# Patient Record
Sex: Male | Born: 1950 | Race: White | Hispanic: No | Marital: Married | Smoking: Never smoker
Health system: Southern US, Community
[De-identification: ages and names within clinical notes are randomized; demographics above are authoritative.]

## PROBLEM LIST (undated history)

## (undated) DIAGNOSIS — I4891 Unspecified atrial fibrillation: Secondary | ICD-10-CM

## (undated) DIAGNOSIS — M791 Myalgia, unspecified site: Secondary | ICD-10-CM

## (undated) DIAGNOSIS — G4730 Sleep apnea, unspecified: Secondary | ICD-10-CM

## (undated) DIAGNOSIS — I514 Myocarditis, unspecified: Secondary | ICD-10-CM

## (undated) HISTORY — DX: Myocarditis, unspecified: I51.4

## (undated) HISTORY — DX: Sleep apnea, unspecified: G47.30

## (undated) HISTORY — DX: Unspecified atrial fibrillation: I48.91

## (undated) HISTORY — DX: Myalgia, unspecified site: M79.10

---

## 1997-04-30 HISTORY — PX: ATRIAL ABLATION SURGERY: SHX560

## 1997-08-16 ENCOUNTER — Ambulatory Visit (HOSPITAL_COMMUNITY): Admission: RE | Admit: 1997-08-16 | Discharge: 1997-08-16 | Payer: Self-pay | Admitting: Cardiology

## 1998-08-02 ENCOUNTER — Encounter: Admission: RE | Admit: 1998-08-02 | Discharge: 1998-08-02 | Payer: Self-pay | Admitting: Family Medicine

## 1999-06-22 ENCOUNTER — Encounter: Admission: RE | Admit: 1999-06-22 | Discharge: 1999-06-22 | Payer: Self-pay | Admitting: Family Medicine

## 1999-10-06 ENCOUNTER — Ambulatory Visit (HOSPITAL_COMMUNITY): Admission: RE | Admit: 1999-10-06 | Discharge: 1999-10-06 | Payer: Self-pay | Admitting: Gastroenterology

## 1999-10-06 ENCOUNTER — Encounter (INDEPENDENT_AMBULATORY_CARE_PROVIDER_SITE_OTHER): Payer: Self-pay | Admitting: *Deleted

## 2001-05-29 ENCOUNTER — Encounter: Admission: RE | Admit: 2001-05-29 | Discharge: 2001-05-29 | Payer: Self-pay | Admitting: Sports Medicine

## 2002-10-06 ENCOUNTER — Emergency Department (HOSPITAL_COMMUNITY): Admission: EM | Admit: 2002-10-06 | Discharge: 2002-10-06 | Payer: Self-pay | Admitting: Emergency Medicine

## 2002-10-06 ENCOUNTER — Encounter: Payer: Self-pay | Admitting: Emergency Medicine

## 2002-10-10 ENCOUNTER — Emergency Department (HOSPITAL_COMMUNITY): Admission: EM | Admit: 2002-10-10 | Discharge: 2002-10-10 | Payer: Self-pay | Admitting: Emergency Medicine

## 2002-10-12 ENCOUNTER — Emergency Department (HOSPITAL_COMMUNITY): Admission: EM | Admit: 2002-10-12 | Discharge: 2002-10-12 | Payer: Self-pay | Admitting: Emergency Medicine

## 2006-06-27 DIAGNOSIS — I4891 Unspecified atrial fibrillation: Secondary | ICD-10-CM | POA: Insufficient documentation

## 2006-06-27 DIAGNOSIS — B351 Tinea unguium: Secondary | ICD-10-CM | POA: Insufficient documentation

## 2006-06-27 DIAGNOSIS — E785 Hyperlipidemia, unspecified: Secondary | ICD-10-CM | POA: Insufficient documentation

## 2007-03-03 ENCOUNTER — Encounter: Payer: Self-pay | Admitting: Cardiovascular Disease

## 2008-12-27 ENCOUNTER — Emergency Department (HOSPITAL_COMMUNITY): Admission: EM | Admit: 2008-12-27 | Discharge: 2008-12-27 | Payer: Self-pay | Admitting: Emergency Medicine

## 2010-09-15 NOTE — Procedures (Signed)
[place]. [facility]  Patient:    William Dodson, William Dodson                     MRN: [id number] Proc. Date: 10/06/99 Adm. Date:  [id number] Disc. Date: [id number] Attending:  Rich Brave CC:         Dr. Electa Sniff, [facility]                           Procedure Report  PROCEDURE:  Colonoscopy with hot biopsy.  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  FINDINGS:  Diminutive rectal polyps. Moderate internal hemorrhoids.  DESCRIPTION OF PROCEDURE:  The nature, purpose and risk of the procedure have been discussed with the patient who provided written consent. At his request, light sedation was administered so we only used fentanyl 40 mcg and versed 3 mg resulting in mild sedation without any arrhythmias or desaturations.  Digital exam of the prostate was partly limited due to increased anal sphincter tone but no abnormalities were appreciated. The Olympus adult video colonoscope was advanced quite easily around the colon to the cecum, turning the patient into the supine position to facilitate passage of the tip of the scope into the base of the cecum. Pullback was then performed. There was a little bit of stool film coating the proximal colon but this was able to be irrigated away to a large extent and it is not felt that any significant lesions would have been missed.  This was an essentially normal examination. There was a tiny sessile polyp in the cecum removed by a single cold biopsy, and there were also a couple of small hyperplastic appearing sessile polyps in the rectum removed by hot biopsy technique. There were also some additional sessile polyps in the rectum which essentially seemed to flatten out with insufflation and I elected not to hot biopsy.  The overall quality of the prep was very good and it is felt that essentially all areas were well seen during this  examination and that no significant lesions would have been missed. Retroflexion was not performed in the rectum due to a fairly small rectal ampulla but pullout through the anal canal demonstrated moderately prominent internal hemorrhoids. No alternative source of rectal bleeding was identified.  No large polyps, cancer, colitis, vascular malformations or diverticular disease were observed during this exam. The patient tolerated the exam well and there were no apparent complications.  Note that the patient received ampicillin 1 gm and gentamycin 80 mg IV because he had been recommended to have antibiotic prophylaxis prior to dental work following an episode of what sounds like viral pericarditis some years ago.  IMPRESSION: 1. Moderate internal hemorrhoids which are the presumed source of the    patients intermittent small volume hematochezia. 2. Diminutive colonic polyps, not likely clinically significant.  PLAN:  Await pathology on the polyps with further management to depend on the histologic findings of the polyps. DD:  10/06/99 TD:  10/10/99 Job: [id number]

## 2010-12-19 ENCOUNTER — Encounter: Payer: Self-pay | Admitting: Cardiovascular Disease

## 2010-12-20 ENCOUNTER — Encounter: Payer: Self-pay | Admitting: *Deleted

## 2010-12-20 ENCOUNTER — Encounter: Payer: Self-pay | Admitting: Cardiovascular Disease

## 2010-12-21 ENCOUNTER — Encounter: Payer: Self-pay | Admitting: Cardiovascular Disease

## 2010-12-21 ENCOUNTER — Ambulatory Visit (INDEPENDENT_AMBULATORY_CARE_PROVIDER_SITE_OTHER): Payer: BC Managed Care – PPO | Admitting: Cardiovascular Disease

## 2010-12-21 ENCOUNTER — Ambulatory Visit (HOSPITAL_COMMUNITY): Payer: BC Managed Care – PPO | Attending: Cardiovascular Disease | Admitting: Radiology

## 2010-12-21 VITALS — BP 110/70 | HR 112 | Resp 12 | Ht 65.0 in | Wt 210.0 lb

## 2010-12-21 DIAGNOSIS — I080 Rheumatic disorders of both mitral and aortic valves: Secondary | ICD-10-CM | POA: Insufficient documentation

## 2010-12-21 DIAGNOSIS — E669 Obesity, unspecified: Secondary | ICD-10-CM | POA: Insufficient documentation

## 2010-12-21 DIAGNOSIS — E785 Hyperlipidemia, unspecified: Secondary | ICD-10-CM

## 2010-12-21 DIAGNOSIS — I079 Rheumatic tricuspid valve disease, unspecified: Secondary | ICD-10-CM | POA: Insufficient documentation

## 2010-12-21 DIAGNOSIS — I4891 Unspecified atrial fibrillation: Secondary | ICD-10-CM

## 2010-12-21 NOTE — Progress Notes (Signed)
60 you with longstanding atrial fibrillation.  Referred by Dr Merla Riches.  Has not been evaluated in about 3 years.  Afib ablation at [facility] ? 96 with Ronn Melena.  Last seen by Dr Shelva Majestic.  Describes having normal cors by cath and normal stress tests in past.  Indicates sever episode of inflamation of heart over 20 years ago that started all his electrical problems. Not clear if this was myocarditis or not.  No records available in echart.  Indicates care by Dr Electa Sniff at time.  [place] score is less than 2.  He is asymptomatic.  Thinks he goes in and out of fibrillation but I suspect it is chronic.  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  8/10 had laceration to left arm from work related accident.  On questioning denies dyspnea, SSCP, syncope or TIAls.  Occasonal palpitations.  Intolerant to multiple beta blockers in past.  Not on any AV nodal blocking drugs at this time and no ASA.  Thought it would be useful for him to F/U with Dr Johney Frame since he has had previous ablation.    ROS: Denies fever, malais, weight loss, blurry vision, decreased visual acuity, cough, sputum, SOB, hemoptysis, pleuritic pain, palpitaitons, heartburn, abdominal pain, melena, lower extremity edema, claudication, or rash.  All other systems reviewed and negative   General: Affect appropriate Healthy:  appears stated age HEENT: normal Neck supple with no adenopathy JVP normal no bruits no thyromegaly Lungs clear with no wheezing and good diaphragmatic motion Heart:  S1/S2 no murmur,rub, gallop or click PMI normal Abdomen: benighn, BS positve, no tenderness, no AAA no bruit.  No HSM or HJR Distal pulses intact with no bruits No edema Neuro non-focal Skin warm and dry No muscular weakness  Medications Current Outpatient Prescriptions  Medication Sig Dispense Refill  . co-enzyme Q-10 30 MG capsule Take 30 mg by mouth daily.        . NON FORMULARY Vitamin  E 1 tab po qd       . NON FORMULARY Garlic 1 tab po qd       . Omega-3 Fatty Acids (FISH OIL PO) Take by mouth daily.          Allergies Doxycycline; Hydrocodone; and Septra  Family History: No family history on file.  Social History: History   Social History  . Marital Status: Single    Spouse Name: N/A    Number of Children: N/A  . Years of Education: N/A   Occupational History  . Not on file.   Social History Main Topics  . Smoking status: Former Games developer  . Smokeless tobacco: Not on file  . Alcohol Use: Not on file  . Drug Use: Not on file  . Sexually Active: Not on file   Other Topics Concern  . Not on file   Social History Narrative  . No narrative on file    Electrocardiogram:  Afib 112 nonspecific ST/T wave changes  Assessment and Plan

## 2010-12-21 NOTE — Assessment & Plan Note (Signed)
Low carb low fat diet F/U Dr Merla Riches. No known vascular disease

## 2010-12-21 NOTE — Patient Instructions (Addendum)
Your physician has requested that you have an echocardiogram. Echocardiography is a painless test that uses sound waves to create images of your heart. It provides your doctor with information about the size and shape of your heart and how well your heart's chambers and valves are working. This procedure takes approximately one hour. There are no restrictions for this procedure.    You have been referred to Dr Johney Frame for Atrial Fibrillation- Previous Ablation

## 2010-12-21 NOTE — Assessment & Plan Note (Signed)
Have sent for records at [facility].  Not on ASA or AV nodal blocking drugs.  Would agree with patient that risk of anticoagulation higher than risk of stroke given his occupation.  Echo and F/U [facility].  Dont know if it is worth seeing if he is persistant or paroxysmal as he is asymptomatic.  Consdier ASA

## 2010-12-25 ENCOUNTER — Encounter: Payer: Self-pay | Admitting: *Deleted

## 2011-01-04 ENCOUNTER — Telehealth: Payer: Self-pay | Admitting: *Deleted

## 2011-01-04 MED ORDER — DIGOXIN 125 MCG PO TABS: 125.0000 ug | ORAL_TABLET | Freq: Every day | ORAL | Status: DC

## 2011-01-04 MED ORDER — LISINOPRIL 5 MG PO TABS: 5.0000 mg | ORAL_TABLET | Freq: Every day | ORAL | Status: DC

## 2011-01-04 NOTE — Telephone Encounter (Signed)
Message copied by Freddi Starr on Thu Jan 04, 2011  5:27 PM ------      Message from: Wendall Stade      Created: Mon Dec 25, 2010  1:49 PM       EF 40-45% see if he is willing to try Digoxen .125mg  and lisinopril 5mg   Intolerant to beta blockers in past

## 2011-01-04 NOTE — Telephone Encounter (Signed)
Spoke with pt, echo results from 2008 reviewed by dr Eden Emms. Explained to pt that in 2008 his EF% was normal and now it is lower. Dr Eden Emms felt this maybe due to the pts heart rate being elevated. Pt is willing to try dig and lisinopril. He will call with problems after starting new meds Deliah Goody

## 2011-01-26 ENCOUNTER — Encounter: Payer: Self-pay | Admitting: Internal Medicine

## 2011-01-29 ENCOUNTER — Ambulatory Visit (INDEPENDENT_AMBULATORY_CARE_PROVIDER_SITE_OTHER): Payer: BC Managed Care – PPO | Admitting: Internal Medicine

## 2011-01-29 ENCOUNTER — Encounter: Payer: Self-pay | Admitting: Internal Medicine

## 2011-01-29 DIAGNOSIS — I519 Heart disease, unspecified: Secondary | ICD-10-CM

## 2011-01-29 DIAGNOSIS — G4730 Sleep apnea, unspecified: Secondary | ICD-10-CM | POA: Insufficient documentation

## 2011-01-29 DIAGNOSIS — I4891 Unspecified atrial fibrillation: Secondary | ICD-10-CM

## 2011-01-29 MED ORDER — DIGOXIN 125 MCG PO TABS: 125.0000 ug | ORAL_TABLET | Freq: Every day | ORAL | Status: DC

## 2011-01-29 NOTE — Assessment & Plan Note (Signed)
EF 40-45% No symptoms of ischemia or CHF.  The importance of heart rate control was stressed today. I have also encouraged compliance with lisinopril as ordered by Dr Eden Emms.   He will follow-up with Dr Eden Emms and I will see him as needed.

## 2011-01-29 NOTE — Patient Instructions (Signed)
Your physician recommends that you schedule a follow-up appointment as needed  

## 2011-01-29 NOTE — Assessment & Plan Note (Signed)
He recently had a sleep study obtained by his PCP. He will follow for results with his PCP.

## 2011-01-29 NOTE — Progress Notes (Signed)
William Dodson is a pleasant 60 y.o. yo patient with a h/o persistent atrial fibrillation who presents today for EP consultation.  He reports initially developing atrial fibrillation in the early 1990s.  He reports having "a heart infection" (likely myocarditis) in the setting of pneumonia.  He was observed to have afib at that time.  He reports that he was evaluated by Dr Ronn Melena at [facility] and underwent catheter ablation in the late 1990s.  He has done well since that time.  He was initially followed by Dr Arlyss Gandy but has not seen a physician for 3-4 years.  Most recently, he presented to see Dr Eden Emms and was found to be in afib. The patient reports preserved exercise tolerance, though he admits to dypsnea with ambulation up more than 1 flight of stairs.   Today, he denies symptoms of palpitations, chest pain, shortness of breath with usual activity, orthopnea, PND, lower extremity edema, dizziness, presyncope, syncope, or neurologic sequela. The patient is tolerating medications without difficulties and is otherwise without complaint today. He recently had a sleep study but is unaware of the results.   He has not started lisinopril or digoxin as prescribed by Dr Eden Emms.  He states that he checks his pulse frequently and finds that it is frequently 90s.    Past Medical History  Diagnosis Date  . Sleep apnea     recent sleep study, results pending  . Myalgia   . Atrial fibrillation     persistent,  s/p PVI by Dr Delena Serve at [facility]  . 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 Atrial ablation surgery 1999    afib ablation 1990s at [facility] by Ronn Melena    Current Outpatient Prescriptions  Medication Sig Dispense Refill  . co-enzyme Q-10 30 MG capsule Take 30 mg by mouth daily.        . NON FORMULARY Vitamin E 1 tab po qd       . NON FORMULARY Garlic 1 tab po qd       . Omega-3 Fatty Acids (FISH OIL PO) Take by  mouth daily.        . digoxin (LANOXIN) 0.125 MG tablet Take 1 tablet (125 mcg total) by mouth daily. ( NOT TAKING )  30 tablet  11  . lisinopril (PRINIVIL,ZESTRIL) 5 MG tablet Take 5 mg by mouth daily. ( NOT TAKING )       . DISCONTD: digoxin (LANOXIN) 0.125 MG tablet Take 1 tablet (125 mcg total) by mouth daily.  30 tablet  11  . DISCONTD: digoxin (LANOXIN) 0.125 MG tablet Take 125 mcg by mouth daily. ( NOT TAKING )         Allergies  Allergen Reactions  . Doxycycline     Itching   . Hydrocodone     Itching   . Septra (Bactrim)     Rash     History   Social History  . Marital Status: Single    Spouse Name: N/A    Number of Children: N/A  . Years of Education: N/A   Occupational History  . Sculptor    Social History Main Topics  . Smoking status: Never Smoker   . Smokeless tobacco: Never Used   Comment: tried smoking 30 years ago  . Alcohol Use: Yes     1 glass of wine at night  . Drug Use: No  . Sexually Active:  Not on file   Other Topics Concern  . 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 Diabetes Mother   . Heart disease Father     ROS- All systems are reviewed and negative except as per the HPI above  Physical Exam: Filed Vitals:   01/29/11 0921  BP: 131/91  Pulse: 83  Height: 5\' 4"  (1.626 m)  Weight: 214 lb 8 oz (97.297 kg)    GEN- The patient is well appearing, alert and oriented x 3 today.   Head- normocephalic, atraumatic Eyes-  Sclera clear, conjunctiva pink Ears- hearing intact Oropharynx- clear Neck- supple, no JVP Lymph- no cervical lymphadenopathy Lungs- Clear to ausculation bilaterally, normal work of breathing Heart- irregular rate and rhythm, no murmurs, rubs or gallops, PMI not laterally displaced GI- soft, NT, ND, + BS Extremities- no clubbing, cyanosis, or edema MS- no  significant deformity or atrophy Skin- no rash or lesion Psych- euthymic mood, full affect Neuro- strength and sensation are intact  Echo 8/12 reveals EF 40-45%, mild AI, moderate to severe LA enlargement  Assessment and Plan:

## 2011-01-29 NOTE — Assessment & Plan Note (Signed)
The patient presents today for EP consultation regarding long standing persistent afib.  Fortunately, he is minimally asymptomatic with his afib. His EF is 40-45%.  I have stressed the importance of heart rate control today.  He says that he will begin digoxin as presribed by Dr Eden Emms. As he is doing well at this time, he has no interests in pursuit of rhythm control.  I think that rate control is a reasonable long term strategy.  Given his severe LA enlargment, I think that our ability to maintain sinus rhythm would likely be limited. His CHADSVASC score is 1-2 (possible HTN- he denies, and mildly reduced EF).  He is clear in his decision to not take Coumadin, Xarelto, or pradaxa.   Given his risks for bleeding with his job, this is probably reasonable.  He will try to take low dose ASA.

## 2011-02-16 ENCOUNTER — Encounter: Payer: Self-pay | Admitting: Cardiovascular Disease

## 2011-06-27 ENCOUNTER — Ambulatory Visit (INDEPENDENT_AMBULATORY_CARE_PROVIDER_SITE_OTHER): Payer: BC Managed Care – PPO | Admitting: Internal Medicine

## 2011-06-27 DIAGNOSIS — I10 Essential (primary) hypertension: Secondary | ICD-10-CM

## 2011-06-27 DIAGNOSIS — I4891 Unspecified atrial fibrillation: Secondary | ICD-10-CM

## 2011-06-27 DIAGNOSIS — G4733 Obstructive sleep apnea (adult) (pediatric): Secondary | ICD-10-CM

## 2011-06-27 DIAGNOSIS — R635 Abnormal weight gain: Secondary | ICD-10-CM

## 2011-06-27 NOTE — Progress Notes (Addendum)
Subjective:    Patient ID: William Dodson, male    DOB: 1950/11/27, 61 y.o.   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 He had a recent sleep study which was very positive but did very poorly with all of the mask apparatuses that he has tried. He has not yet had a consult with Dr. Richardean Chimera. He continues to notice non-restorative sleep with easy fatigability and daytime somnolence.   He has chronic atrial fibrillation that did not respond to ablation therapy in the late 90s, and is not a candidate for another surgery due to the markedly dilated left atrium. He is on digoxin 0.125 mg for rate control and has had multiple episodes of tachycardia in recent months. He has not had chest pain, syncope or shortness of breath associated with these. He has noted increased dyspnea on exertion but no peripheral edema. Because of the risk of his work he has elected to avoid anticoagulants other than aspirin. His last echo in October of 2012 showed an ejection fraction of 40-60%. There were no valve lesions.  He has gained 7-10 pounds since September 2012, despite a good diet and adequate exercise.  He also has noticed increased morning stiffness in his hands with pain in his hands at his job which is very labor intensive  Review of Systems     Objective:   Physical Exam There is no thyromegaly The heart has an irregularly irregular rhythm with a rate in the high 90s. The lungs are clear There is no peripheral edema He has mild hypertrophy of all the joints in both hands and mild tenderness      Assessment & Plan:  Problem #1 obstructive sleep apnea Refer to Dr. Richardean Chimera for consult  Problem #2 chronic atrial fibrillation With rapid ventricular response at times Will check digoxin level, metabolic profile, and CBC  Problem #3 hypertension-no change in current meds  Problem #4 recent weight gain TSH and free T4 if indicated  Results for orders placed in  visit on 06/27/11  CBC WITH DIFFERENTIAL      Component Value Range   WBC 6.1  4.0 - 10.5 (K/uL)   RBC 5.18  4.22 - 5.81 (MIL/uL)   Hemoglobin 16.1  13.0 - 17.0 (g/dL)   HCT 78.4  69.6 - 29.5 (%)   MCV 90.3  78.0 - 100.0 (fL)   MCH 31.1  26.0 - 34.0 (pg)   MCHC 34.4  30.0 - 36.0 (g/dL)   RDW 28.4  13.2 - 44.0 (%)   Platelets 212  150 - 400 (K/uL)   Neutrophils Relative 38 (*) 43 - 77 (%)   Neutro Abs 2.3  1.7 - 7.7 (K/uL)   Lymphocytes Relative 51 (*) 12 - 46 (%)   Lymphs Abs 3.1  0.7 - 4.0 (K/uL)   Monocytes Relative 9  3 - 12 (%)   Monocytes Absolute 0.5  0.1 - 1.0 (K/uL)   Eosinophils Relative 2  0 - 5 (%)   Eosinophils Absolute 0.1  0.0 - 0.7 (K/uL)   Basophils Relative 1  0 - 1 (%)   Basophils Absolute 0.1  0.0 - 0.1 (K/uL)   Smear Review Criteria for review not met    DIGOXIN LEVEL      Component Value Range   Digoxin Level 0.3 (*) 0.8 - 2.0 (ng/mL)  LIPID PANEL      Component Value Range   Cholesterol 186  0 - 200 (mg/dL)   Triglycerides  122  <150 (mg/dL)   HDL 51  >16 (mg/dL)   Total CHOL/HDL Ratio 3.6     VLDL 24  0 - 40 (mg/dL)   LDL Cholesterol 109 (*) 0 - 99 (mg/dL)  TSH      Component Value Range   TSH 3.236  0.350 - 4.500 (uIU/mL)  COMPREHENSIVE METABOLIC PANEL      Component Value Range   Sodium 142  135 - 145 (mEq/L)   Potassium 4.2  3.5 - 5.3 (mEq/L)   Chloride 103  96 - 112 (mEq/L)   CO2 26  19 - 32 (mEq/L)   Glucose, Bld 90  70 - 99 (mg/dL)   BUN 14  6 - 23 (mg/dL)   Creat 6.04  5.40 - 9.81 (mg/dL)   Total Bilirubin 0.6  0.3 - 1.2 (mg/dL)   Alkaline Phosphatase 54  39 - 117 (U/L)   AST 32  0 - 37 (U/L)   ALT 43  0 - 53 (U/L)   Total Protein 7.2  6.0 - 8.3 (g/dL)   Albumin 4.4  3.5 - 5.2 (g/dL)   Calcium 9.2  8.4 - 19.1 (mg/dL)    Will forward to Dr Eden Emms to see if going up on the dig would be a good response to increased rate plus dyspnea and easy fatigability

## 2011-06-28 DIAGNOSIS — G4733 Obstructive sleep apnea (adult) (pediatric): Secondary | ICD-10-CM | POA: Insufficient documentation

## 2011-06-28 LAB — COMPREHENSIVE METABOLIC PANEL
AST: 32 U/L (ref 0–37)
Alkaline Phosphatase: 54 U/L (ref 39–117)
BUN: 14 mg/dL (ref 6–23)
Creat: 0.96 mg/dL (ref 0.50–1.35)

## 2011-06-28 LAB — CBC WITH DIFFERENTIAL/PLATELET
Basophils Absolute: 0.1 10*3/uL (ref 0.0–0.1)
Eosinophils Absolute: 0.1 10*3/uL (ref 0.0–0.7)
Eosinophils Relative: 2 % (ref 0–5)
HCT: 46.8 % (ref 39.0–52.0)
Lymphocytes Relative: 51 % — ABNORMAL HIGH (ref 12–46)
Lymphs Abs: 3.1 10*3/uL (ref 0.7–4.0)
MCH: 31.1 pg (ref 26.0–34.0)
MCV: 90.3 fL (ref 78.0–100.0)
Monocytes Absolute: 0.5 10*3/uL (ref 0.1–1.0)
RDW: 13.9 % (ref 11.5–15.5)
WBC: 6.1 10*3/uL (ref 4.0–10.5)

## 2011-06-28 LAB — LIPID PANEL
HDL: 51 mg/dL (ref >39)
LDL Cholesterol: 111 mg/dL — ABNORMAL HIGH (ref 0–99)
Triglycerides: 122 mg/dL (ref <150)
VLDL: 24 mg/dL (ref 0–40)

## 2011-06-29 ENCOUNTER — Encounter: Payer: Self-pay | Admitting: Internal Medicine

## 2011-06-30 NOTE — Progress Notes (Signed)
Going up on Digoxen to .25mg /day is fine although he had not been taking his Digoxen in the past.

## 2011-07-04 ENCOUNTER — Encounter: Payer: Self-pay | Admitting: Internal Medicine

## 2011-07-04 MED ORDER — DIGOXIN 250 MCG PO TABS: 250.0000 ug | ORAL_TABLET | Freq: Every day | ORAL | Status: DC

## 2011-07-04 NOTE — Progress Notes (Signed)
Addended by: Tonye Pearson on: 07/04/2011 07:35 PM   Modules accepted: Orders

## 2013-08-12 ENCOUNTER — Ambulatory Visit (INDEPENDENT_AMBULATORY_CARE_PROVIDER_SITE_OTHER): Payer: BC Managed Care – PPO | Admitting: Internal Medicine

## 2013-08-12 ENCOUNTER — Encounter: Payer: Self-pay | Admitting: Internal Medicine

## 2013-08-12 VITALS — BP 129/89 | HR 88 | Temp 98.2°F | Resp 16 | Ht 63.75 in | Wt 217.4 lb

## 2013-08-12 DIAGNOSIS — Z131 Encounter for screening for diabetes mellitus: Secondary | ICD-10-CM

## 2013-08-12 DIAGNOSIS — R635 Abnormal weight gain: Secondary | ICD-10-CM

## 2013-08-12 DIAGNOSIS — M549 Dorsalgia, unspecified: Secondary | ICD-10-CM

## 2013-08-12 DIAGNOSIS — G4733 Obstructive sleep apnea (adult) (pediatric): Secondary | ICD-10-CM

## 2013-08-12 DIAGNOSIS — R202 Paresthesia of skin: Secondary | ICD-10-CM

## 2013-08-12 DIAGNOSIS — R209 Unspecified disturbances of skin sensation: Secondary | ICD-10-CM

## 2013-08-12 DIAGNOSIS — M79609 Pain in unspecified limb: Secondary | ICD-10-CM

## 2013-08-12 LAB — CBC WITH DIFFERENTIAL/PLATELET
Basophils Absolute: 0.1 10*3/uL (ref 0.0–0.1)
Basophils Relative: 1 % (ref 0–1)
EOS PCT: 2 % (ref 0–5)
Eosinophils Absolute: 0.1 10*3/uL (ref 0.0–0.7)
HEMATOCRIT: 46.6 % (ref 39.0–52.0)
Hemoglobin: 16.5 g/dL (ref 13.0–17.0)
LYMPHS ABS: 2.9 10*3/uL (ref 0.7–4.0)
LYMPHS PCT: 50 % — AB (ref 12–46)
MCH: 31.1 pg (ref 26.0–34.0)
MCHC: 35.4 g/dL (ref 30.0–36.0)
MCV: 87.9 fL (ref 78.0–100.0)
MONO ABS: 0.7 10*3/uL (ref 0.1–1.0)
Monocytes Relative: 12 % (ref 3–12)
Neutro Abs: 2 10*3/uL (ref 1.7–7.7)
Neutrophils Relative %: 35 % — ABNORMAL LOW (ref 43–77)
Platelets: 212 10*3/uL (ref 150–400)
RBC: 5.3 MIL/uL (ref 4.22–5.81)
RDW: 13.9 % (ref 11.5–15.5)
WBC: 5.8 10*3/uL (ref 4.0–10.5)

## 2013-08-12 LAB — POCT GLYCOSYLATED HEMOGLOBIN (HGB A1C): Hemoglobin A1C: 5.9

## 2013-08-12 LAB — COMPLETE METABOLIC PANEL WITH GFR
ALK PHOS: 51 U/L (ref 39–117)
ALT: 38 U/L (ref 0–53)
AST: 30 U/L (ref 0–37)
Albumin: 4.5 g/dL (ref 3.5–5.2)
BUN: 14 mg/dL (ref 6–23)
CO2: 25 mEq/L (ref 19–32)
CREATININE: 1.05 mg/dL (ref 0.50–1.35)
Calcium: 9.6 mg/dL (ref 8.4–10.5)
Chloride: 104 mEq/L (ref 96–112)
GFR, Est African American: 87 mL/min
GFR, Est Non African American: 75 mL/min
Glucose, Bld: 84 mg/dL (ref 70–99)
POTASSIUM: 4.2 meq/L (ref 3.5–5.3)
Sodium: 141 mEq/L (ref 135–145)
Total Bilirubin: 0.9 mg/dL (ref 0.2–1.2)
Total Protein: 7.2 g/dL (ref 6.0–8.3)

## 2013-08-12 LAB — TSH: TSH: 2.906 u[IU]/mL (ref 0.350–4.500)

## 2013-08-12 LAB — LIPID PANEL
CHOL/HDL RATIO: 3.9 ratio
CHOLESTEROL: 206 mg/dL — AB (ref 0–200)
HDL: 53 mg/dL (ref >39)
LDL Cholesterol: 132 mg/dL — ABNORMAL HIGH (ref 0–99)
Triglycerides: 105 mg/dL (ref <150)
VLDL: 21 mg/dL (ref 0–40)

## 2013-08-12 NOTE — Progress Notes (Addendum)
Subjective:    Patient ID: William Dodson, male    DOB: [birth year], 63 y.o.   MRN: [id number] This chart was scribed for Leandrew Koyanagi, MD by Ladene Artist, ED Scribe. The patient was seen in room 27. Patient's care was started at 4:03 PM.  HPI HPI Comments: William Dodson is a 63 y.o. male who presents to the [facility] for follow-up.  Sent in by OD. Pt has a blood vessel leak minimal blood into the retina =perimacular heme OS acc to [employer], O.D.  No vision loss.  Pt reports good bp readings; today's bp is 129/89. He suspects that his ocassional high bp readings at the office are due to stress of "white coat" type  He expresses concern of potential diabetes. He states that he has gained 10 lbs since his last visit.   Pt reports hand pain after waking. He states that he is able to wiggle his fingers but describes the quality pain as a tingling sensation. Pt states that he has broken 3 coffee pitchers due to hand pain. Pain eases throughout the day. He denies pain while working. He states that the heat from glass blowing improves hand pain the following day. 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  Pt uses a CPAP machine and sleeps 5-6 hours without disturbances. His sister and brother-in-law also use CPAP.  Pt has been seeing an orthopedist about intermittent back pain onset December 2014. He wears a back brace for support and reports relief later in the day.  Patient Active Problem List   Diagnosis Date Noted   Obstructive sleep apnea 06/28/2011   Sleep apnea 83/72/9021   Chronic systolic dysfunction of left ventricle 01/29/2011   ONYCHOMYCOSIS 06/27/2006   HYPERLIPIDEMIA 06/27/2006   ATRIAL FIBRILLATION---since CPAP has noted no afib!!! 06/27/2006   Current Outpatient Prescriptions on File Prior to Visit  Medication Sig Dispense Refill   NON FORMULARY Vitamin E 1 tab po qd         NON FORMULARY Garlic 1 tab po qd        Omega-3 Fatty Acids (FISH OIL PO) Take by mouth daily.         co-enzyme Q-10 30 MG capsule Take 30 mg by mouth daily.         No current facility-administered medications on file prior to visit.     Review of Systems  Constitutional: Negative for activity change, appetite change, fatigue and unexpected weight change.  HENT: Negative for trouble swallowing.   Eyes: Negative for visual disturbance.  Respiratory: Negative for cough, shortness of breath and wheezing.   Cardiovascular: Negative for chest pain, palpitations and leg swelling.  Gastrointestinal: Negative for abdominal pain.  Genitourinary: Negative for difficulty urinating.  Musculoskeletal: Positive for arthralgias and back pain. Negative for joint swelling.  Skin: Negative for rash.  Neurological: Negative for headaches.  Psychiatric/Behavioral: Negative for sleep disturbance.      Objective:   Physical Exam  Nursing note and vitals reviewed. Constitutional: He is oriented to person, place, and time. He appears well-developed and well-nourished. No distress.  HENT:  Head: Normocephalic and atraumatic.  Eyes: Conjunctivae and EOM are normal. Pupils are equal, round, and reactive to light.  Neck: Normal range of motion. Neck supple. No thyromegaly present.  Cardiovascular: Normal rate, regular rhythm, normal heart sounds and intact distal pulses.   No murmur heard. Pulmonary/Chest: Effort normal and breath sounds normal.  No respiratory distress.  Musculoskeletal: Normal range of motion. He exhibits no edema.  Wrist extension =increased discomfort, tingling with continued position///thick ness of flex retinac//pos tinel's Grips symmetr//  Lymphadenopathy:    He has no cervical adenopathy.  Neurological: He is alert and oriented to person, place, and time. No cranial nerve deficit.  Skin: Skin is warm and dry.  Psychiatric: He has a normal mood and affect. His behavior  is normal.   Wt Readings from Last 3 Encounters:  08/12/13 217 lb 6.4 oz (98.612 kg)  06/28/11 218 lb (98.884 kg)  01/29/11 214 lb 8 oz (97.297 kg)   Results for orders placed in visit on 08/12/13  POCT GLYCOSYLATED HEMOGLOBIN (HGB A1C)      Result Value Ref Range   Hemoglobin A1C 5.9         Assessment & Plan:  I have completed the patient encounter in its entirety as documented by the scribe, with editing by me where necessary. Robert P. Laney Pastor, M.D.  Macular lesion OS-- POCT glycosylated hemoglobin (Hb A1C), COMPLETE METABOLIC PANEL WITH GFR, CBC with Differential, TSH, PSA, Lipid panel  Back pain---flexibility exercises  OSA (obstructive sleep apnea)--contin rx  Paresthesia--suggests CTS bilat--to Dr Amedeo Plenty for consult due to livelihood!!!--start night splints w/ rom  Atrial Fib-declines coumadin/rate stable//much improved since   He is not in favor of meds or card f/u   Labs= Results for orders placed in visit on 08/12/13  COMPLETE METABOLIC PANEL WITH GFR      Result Value Ref Range   Sodium 141  135 - 145 mEq/L   Potassium 4.2  3.5 - 5.3 mEq/L   Chloride 104  96 - 112 mEq/L   CO2 25  19 - 32 mEq/L   Glucose, Bld 84  70 - 99 mg/dL   BUN 14  6 - 23 mg/dL   Creat 1.05  0.50 - 1.35 mg/dL   Total Bilirubin 0.9  0.2 - 1.2 mg/dL   Alkaline Phosphatase 51  39 - 117 U/L   AST 30  0 - 37 U/L   ALT 38  0 - 53 U/L   Total Protein 7.2  6.0 - 8.3 g/dL   Albumin 4.5  3.5 - 5.2 g/dL   Calcium 9.6  8.4 - 10.5 mg/dL   GFR, Est African American 87     GFR, Est Non African American 75    CBC WITH DIFFERENTIAL      Result Value Ref Range   WBC 5.8  4.0 - 10.5 K/uL   RBC 5.30  4.22 - 5.81 MIL/uL   Hemoglobin 16.5  13.0 - 17.0 g/dL   HCT 46.6  39.0 - 52.0 %   MCV 87.9  78.0 - 100.0 fL   MCH 31.1  26.0 - 34.0 pg   MCHC 35.4  30.0 - 36.0 g/dL   RDW 13.9  11.5 - 15.5 %   Platelets 212  150 - 400 K/uL   Neutrophils Relative % 35 (*) 43 - 77 %   Neutro Abs 2.0  1.7 - 7.7  K/uL   Lymphocytes Relative 50 (*) 12 - 46 %   Lymphs Abs 2.9  0.7 - 4.0 K/uL   Monocytes Relative 12  3 - 12 %   Monocytes Absolute 0.7  0.1 - 1.0 K/uL   Eosinophils Relative 2  0 - 5 %   Eosinophils Absolute 0.1  0.0 - 0.7 K/uL   Basophils Relative 1  0 - 1 %  Basophils Absolute 0.1  0.0 - 0.1 K/uL   Smear Review Criteria for review not met    TSH      Result Value Ref Range   TSH 2.906  0.350 - 4.500 uIU/mL  PSA      Result Value Ref Range   PSA 1.23  <=4.00 ng/mL  LIPID PANEL      Result Value Ref Range   Cholesterol 206 (*) 0 - 200 mg/dL   Triglycerides 105  <150 mg/dL   HDL 53  >39 mg/dL   Total CHOL/HDL Ratio 3.9     VLDL 21  0 - 40 mg/dL   LDL Cholesterol 132 (*) 0 - 99 mg/dL  POCT GLYCOSYLATED HEMOGLOBIN (HGB A1C)      Result Value Ref Range   Hemoglobin A1C 5.9

## 2013-08-13 LAB — PSA: PSA: 1.23 ng/mL (ref <=4.00)

## 2013-08-16 ENCOUNTER — Encounter: Payer: Self-pay | Admitting: Internal Medicine

## 2015-07-22 ENCOUNTER — Encounter (HOSPITAL_COMMUNITY): Payer: Self-pay | Admitting: Emergency Medicine

## 2015-07-22 ENCOUNTER — Ambulatory Visit (INDEPENDENT_AMBULATORY_CARE_PROVIDER_SITE_OTHER): Payer: BLUE CROSS/BLUE SHIELD | Admitting: Emergency Medicine

## 2015-07-22 ENCOUNTER — Ambulatory Visit (INDEPENDENT_AMBULATORY_CARE_PROVIDER_SITE_OTHER): Payer: BLUE CROSS/BLUE SHIELD

## 2015-07-22 ENCOUNTER — Emergency Department (HOSPITAL_COMMUNITY)
Admission: EM | Admit: 2015-07-22 | Discharge: 2015-07-22 | Disposition: A | Discharge status: Home / Self Care | Payer: BLUE CROSS/BLUE SHIELD | Attending: Emergency Medicine | Admitting: Emergency Medicine

## 2015-07-22 ENCOUNTER — Emergency Department (HOSPITAL_COMMUNITY): Payer: BLUE CROSS/BLUE SHIELD

## 2015-07-22 VITALS — BP 106/78 | HR 98 | Temp 98.7°F | Resp 16 | Ht 64.0 in | Wt 227.8 lb

## 2015-07-22 DIAGNOSIS — I480 Paroxysmal atrial fibrillation: Secondary | ICD-10-CM | POA: Insufficient documentation

## 2015-07-22 DIAGNOSIS — Y998 Other external cause status: Secondary | ICD-10-CM | POA: Insufficient documentation

## 2015-07-22 DIAGNOSIS — R059 Cough, unspecified: Secondary | ICD-10-CM

## 2015-07-22 DIAGNOSIS — R05 Cough: Secondary | ICD-10-CM | POA: Insufficient documentation

## 2015-07-22 DIAGNOSIS — X58XXXA Exposure to other specified factors, initial encounter: Secondary | ICD-10-CM | POA: Diagnosis not present

## 2015-07-22 DIAGNOSIS — Y9289 Other specified places as the place of occurrence of the external cause: Secondary | ICD-10-CM | POA: Diagnosis not present

## 2015-07-22 DIAGNOSIS — S0990XA Unspecified injury of head, initial encounter: Secondary | ICD-10-CM | POA: Insufficient documentation

## 2015-07-22 DIAGNOSIS — S0012XA Contusion of left eyelid and periocular area, initial encounter: Secondary | ICD-10-CM | POA: Diagnosis not present

## 2015-07-22 DIAGNOSIS — R55 Syncope and collapse: Secondary | ICD-10-CM

## 2015-07-22 DIAGNOSIS — Z8669 Personal history of other diseases of the nervous system and sense organs: Secondary | ICD-10-CM | POA: Insufficient documentation

## 2015-07-22 DIAGNOSIS — Z79899 Other long term (current) drug therapy: Secondary | ICD-10-CM | POA: Insufficient documentation

## 2015-07-22 DIAGNOSIS — R002 Palpitations: Secondary | ICD-10-CM | POA: Diagnosis not present

## 2015-07-22 DIAGNOSIS — Y9389 Activity, other specified: Secondary | ICD-10-CM | POA: Insufficient documentation

## 2015-07-22 LAB — POCT CBC
GRANULOCYTE PERCENT: 43.9 % (ref 37–80)
HEMATOCRIT: 44 % (ref 43.5–53.7)
Hemoglobin: 16.4 g/dL (ref 14.1–18.1)
Lymph, poc: 3 (ref 0.6–3.4)
MCH: 33.6 pg — AB (ref 27–31.2)
MCHC: 37.4 g/dL — AB (ref 31.8–35.4)
MCV: 90 fL (ref 80–97)
MID (CBC): 0.6 (ref 0–0.9)
MPV: 7.6 fL (ref 0–99.8)
PLATELET COUNT, POC: 154 10*3/uL (ref 142–424)
POC Granulocyte: 2.8 (ref 2–6.9)
POC LYMPH %: 47 % (ref 10–50)
POC MID %: 9.1 %M (ref 0–12)
RBC: 4.89 M/uL (ref 4.69–6.13)
RDW, POC: 14.1 %
WBC: 6.4 10*3/uL (ref 4.6–10.2)

## 2015-07-22 LAB — I-STAT CHEM 8, ED
BUN: 18 mg/dL (ref 6–20)
CHLORIDE: 103 mmol/L (ref 101–111)
Calcium, Ion: 1.1 mmol/L — ABNORMAL LOW (ref 1.13–1.30)
Creatinine, Ser: 0.9 mg/dL (ref 0.61–1.24)
GLUCOSE: 134 mg/dL — AB (ref 65–99)
HEMATOCRIT: 50 % (ref 39.0–52.0)
HEMOGLOBIN: 17 g/dL (ref 13.0–17.0)
POTASSIUM: 3.7 mmol/L (ref 3.5–5.1)
SODIUM: 141 mmol/L (ref 135–145)
TCO2: 23 mmol/L (ref 0–100)

## 2015-07-22 LAB — D-DIMER, QUANTITATIVE (NOT AT ARMC)

## 2015-07-22 LAB — TROPONIN I

## 2015-07-22 NOTE — ED Provider Notes (Signed)
CSN: [id number]     Arrival date & time 07/22/15  1619 History   First MD Initiated Contact with Patient 07/22/15 1620     Chief Complaint  Patient presents with  . Cough    HPI Patient resents with cough and syncope. States that he has had cough around the last month. Return no real production. No fevers. States that he travels a lot for his job. States that he was in [place] 2 days ago and had a cough and then woke up on the floor. His bruising to his left forehead and particle black eye. He is not on anticoagulation despite having atrial fibrillation. No real confusion. Has had a occasional evening headache which is not unusual for him. No chest pain. No trouble breathing. No swelling in his legs. Does not smoke. He was seen at his primary care doctor and sent here. No numbness weakness. No neck pain. Pertussis titers were sent at the office. CBC was also done and was reassuring.   Past Medical History  Diagnosis Date  . Sleep apnea     recent sleep study, results pending  . Myalgia   . Atrial fibrillation (HCC)     persistent,  s/p PVI by Dr Delena Serve at [facility]  . 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(HCC)     "heart infection" in setting of pneumonia 1993   Past Surgical History  Procedure Laterality Date  . Atrial ablation surgery  1999    afib ablation 1990s at [facility] by Ronn Melena   Family History  Problem Relation Age of Onset  . Diabetes Mother   . Cancer Mother   . Mental illness Mother   . Heart disease Father   . Hyperlipidemia Father   . Mental illness Brother   . Diabetes Maternal Grandmother   . Heart disease Paternal Grandmother    Social History  Substance Use Topics  . Smoking status: Never Smoker   . Smokeless tobacco: Never Used     Comment: tried smoking 30 years ago  . Alcohol Use: 3.6 oz/week    6 Glasses of wine per week     Comment: 1 glass of wine at night    Review of Systems  Constitutional: Negative for activity change and appetite change.  Eyes: Negative for  pain.  Respiratory: Positive for cough. Negative for chest tightness and shortness of breath.   Cardiovascular: Negative for chest pain and leg swelling.  Gastrointestinal: Negative for nausea, vomiting, abdominal pain and diarrhea.  Genitourinary: Negative for flank pain.  Musculoskeletal: Negative for back pain and neck stiffness.  Skin: Negative for rash.  Neurological: Positive for syncope and headaches. Negative for weakness and numbness.  Psychiatric/Behavioral: Negative for behavioral problems.      Allergies  Morphine and related; Doxycycline; Hydrocodone; and Septra  Home Medications   Prior to Admission medications   Medication Sig Start Date End Date Taking? Authorizing Provider  ARGININE PO Take 1 tablet by mouth daily as needed (FEW TIMES WEEKLY).   Yes Historical Provider, MD  Ascorbic Acid (VITAMIN C PO) Take 1 tablet by mouth daily as needed (FEW TIMES WEEKLY).   Yes Historical Provider, MD  b complex vitamins tablet Take 1 tablet by mouth daily as needed (FEW TIMES WEEKLY).   Yes Historical Provider, MD  Cholecalciferol (VITAMIN D PO) Take 1 tablet by mouth daily as needed (FEW TIMES WEEKLY).   Yes Historical Provider, MD  Chromium Picolinate (CHROMIUM PICOLATE PO) Take 1 tablet by mouth daily as needed (FEW TIMES WEEKLY).  Yes Historical Provider, MD  GARLIC OIL PO Take 1 tablet by mouth daily as needed (FEW TIMES WEEKLY).   Yes Historical Provider, MD  guaifenesin (ROBITUSSIN) 100 MG/5ML syrup Take 200 mg by mouth 3 (three) times daily as needed for cough.   Yes Historical Provider, MD  ibuprofen (ADVIL,MOTRIN) 200 MG tablet Take 400 mg by mouth every 6 (six) hours as needed for moderate pain.   Yes Historical Provider, MD  IODINE, KELP, PO Take 1 tablet by mouth daily as needed (FEW TIMES WEEKLY).   Yes Historical Provider, MD  KRILL OIL PO Take 1 capsule by mouth daily as needed (FEW TIMES WEEKLY).   Yes Historical Provider, MD  MAGNESIUM PO Take 1 tablet by mouth  daily as needed (FEW TIMES WEEKLY).    Yes Historical Provider, MD  Menaquinone-7 (VITAMIN K2 PO) Take 1 tablet by mouth daily as needed (FEW TIMES WEEKLY).   Yes Historical Provider, MD  MILK THISTLE PO Take 1 tablet by mouth daily as needed (FEW TIMES WEEKLY).   Yes Historical Provider, MD  Misc Natural Products (SAW PALMETTO) CAPS Take 1 capsule by mouth daily as needed (FEW TIMES WEEKLY).   Yes Historical Provider, MD  Throat Lozenges (COUGH DROPS MT) Use as directed 1 lozenge in the mouth or throat every 2 (two) hours as needed (for cough).   Yes Historical Provider, MD  Ubiquinol 100 MG CAPS Take 100 mg by mouth daily as needed (FEW TIMES WEEKLY).   Yes Historical Provider, MD  VITAMIN A PO Take 1 capsule by mouth daily as needed (FEW TIMES WEEKLY).    Yes Historical Provider, MD   BP 132/87 mmHg  Pulse 86  Temp(Src) 98.7 F (37.1 C) (Oral)  Resp 15  Ht 5\' 4"  (1.626 m)  Wt 225 lb (102.059 kg)  BMI 38.60 kg/m2  SpO2 99% Physical Exam  Constitutional: He is oriented to person, place, and time. He appears well-developed and well-nourished.  HENT:  Head: Normocephalic and atraumatic.  Ecchymosis and abrasion horizontally above left eye. Periorbital ecchymosis on left side. No step-off deformity or severe tenderness at the site. Extraocular movements intact.  Eyes: EOM are normal. Pupils are equal, round, and reactive to light.  Neck: Normal range of motion. Neck supple.  Cardiovascular: Normal rate, regular rhythm and normal heart sounds.   No murmur heard. Pulmonary/Chest: Effort normal and breath sounds normal.  Abdominal: Soft. Bowel sounds are normal. He exhibits no distension and no mass. There is no tenderness. There is no rebound and no guarding.  Musculoskeletal: Normal range of motion. He exhibits no edema.  Neurological: He is alert and oriented to person, place, and time. No cranial nerve deficit.  Skin: Skin is warm and dry.  Nursing note and vitals reviewed.   ED  Course  Procedures (including critical care time) Labs Review Labs Reviewed  I-STAT CHEM 8, ED - Abnormal; Notable for the following:    Glucose, Bld 134 (*)    Calcium, Ion 1.10 (*)    All other components within normal limits  D-DIMER, QUANTITATIVE (NOT AT [facility])  TROPONIN I    Imaging Review Dg Chest 2 View  07/22/2015  CLINICAL DATA:  Shortness of breath ( EXAM: CHEST  2 VIEW COMPARISON:  12/19/2010 FINDINGS: Normal mediastinum and cardiac silhouette. Normal pulmonary vasculature. No evidence of effusion, infiltrate, or pneumothorax. No acute bony abnormality. IMPRESSION: No acute cardiopulmonary process. Electronically Signed   By: Genevive BiStewart  Edmunds M.D.   On: 07/22/2015 15:15  Ct Head Wo Contrast  07/22/2015  CLINICAL DATA:  Left posterior headache, abrasion to left forehead, fall and syncope EXAM: CT HEAD WITHOUT CONTRAST TECHNIQUE: Contiguous axial images were obtained from the base of the skull through the vertex without intravenous contrast. COMPARISON:  None. FINDINGS: Moderate to severe diffuse cortical atrophy. No abnormal attenuation to suggest mass or vascular territory infarct. No hemorrhage or extra-axial fluid. No hydrocephalus. Calvarium intact. Visualized portions of the paranasal sinuses clear. IMPRESSION: Age-related atrophy with no acute findings Electronically Signed   By: Esperanza Heir M.D.   On: 07/22/2015 17:31   I have personally reviewed and evaluated these images and lab results as part of my medical decision-making.   EKG Interpretation None      MDM   Final diagnoses:  Cough  Syncope, unspecified syncope type  Minor head injury, initial encounter    Patient was syncope. Began after coughing. 2 days ago in [place]. With recent plane flights and car rides d-dimer checked and was negative. Likely syncope due to cough. Pertussis checked at PCP and will follow. Will discharge home. EKG also done at primary and was reviewed by me.    Benjiman Core,  MD 07/22/15 2005

## 2015-07-22 NOTE — Discharge Instructions (Signed)
Cough, Adult Coughing is a reflex that clears your throat and your airways. Coughing helps to heal and protect your lungs. It is normal to cough occasionally, but a cough that happens with other symptoms or lasts a long time may be a sign of a condition that needs treatment. A cough may last only 2-3 weeks (acute), or it may last longer than 8 weeks (chronic). CAUSES Coughing is commonly caused by:  Breathing in substances that irritate your lungs.  A viral or bacterial respiratory infection.  Allergies.  Asthma.  Postnasal drip.  Smoking.  Acid backing up from the stomach into the esophagus (gastroesophageal reflux).  Certain medicines.  Chronic lung problems, including COPD (or rarely, lung cancer).  Other medical conditions such as heart failure. HOME CARE INSTRUCTIONS  Pay attention to any changes in your symptoms. Take these actions to help with your discomfort:  Take medicines only as told by your health care provider.  If you were prescribed an antibiotic medicine, take it as told by your health care provider. Do not stop taking the antibiotic even if you start to feel better.  Talk with your health care provider before you take a cough suppressant medicine.  Drink enough fluid to keep your urine clear or pale yellow.  If the air is dry, use a cold steam vaporizer or humidifier in your bedroom or your home to help loosen secretions.  Avoid anything that causes you to cough at work or at home.  If your cough is worse at night, try sleeping in a semi-upright position.  Avoid cigarette smoke. If you smoke, quit smoking. If you need help quitting, ask your health care provider.  Avoid caffeine.  Avoid alcohol.  Rest as needed. SEEK MEDICAL CARE IF:   You have new symptoms.  You cough up pus.  Your cough does not get better after 2-3 weeks, or your cough gets worse.  You cannot control your cough with suppressant medicines and you are losing sleep.  You  develop pain that is getting worse or pain that is not controlled with pain medicines.  You have a fever.  You have unexplained weight loss.  You have night sweats. SEEK IMMEDIATE MEDICAL CARE IF:  You cough up blood.  You have difficulty breathing.  Your heartbeat is very fast.   This information is not intended to replace advice given to you by your health care provider. Make sure you discuss any questions you have with your health care provider.   Document Released: 10/13/2010 Document Revised: 01/05/2015 Document Reviewed: 06/23/2014 Elsevier Interactive Patient Education 2016 [employer].  Syncope Syncope is a medical term for fainting or passing out. This means you lose consciousness and drop to the ground. People are generally unconscious for less than 5 minutes. You may have some muscle twitches for up to 15 seconds before waking up and returning to normal. Syncope occurs more often in older adults, but it can happen to anyone. While most causes of syncope are not dangerous, syncope can be a sign of a serious medical problem. It is important to seek medical care.  CAUSES  Syncope is caused by a sudden drop in blood flow to the brain. The specific cause is often not determined. Factors that can bring on syncope include:  Taking medicines that lower blood pressure.  Sudden changes in posture, such as standing up quickly.  Taking more medicine than prescribed.  Standing in one place for too long.  Seizure disorders.  Dehydration and excessive exposure  to heat.  Low blood sugar (hypoglycemia).  Straining to have a bowel movement.  Heart disease, irregular heartbeat, or other circulatory problems.  Fear, emotional distress, seeing blood, or severe pain. SYMPTOMS  Right before fainting, you may:  Feel dizzy or light-headed.  Feel nauseous.  See all white or all black in your field of vision.  Have cold, clammy skin. DIAGNOSIS  Your health care provider will  ask about your symptoms, perform a physical exam, and perform an electrocardiogram (ECG) to record the electrical activity of your heart. Your health care provider may also perform other heart or blood tests to determine the cause of your syncope which may include:  Transthoracic echocardiogram (TTE). During echocardiography, sound waves are used to evaluate how blood flows through your heart.  Transesophageal echocardiogram (TEE).  Cardiac monitoring. This allows your health care provider to monitor your heart rate and rhythm in real time.  Holter monitor. This is a portable device that records your heartbeat and can help diagnose heart arrhythmias. It allows your health care provider to track your heart activity for several days, if needed.  Stress tests by exercise or by giving medicine that makes the heart beat faster. TREATMENT  In most cases, no treatment is needed. Depending on the cause of your syncope, your health care provider may recommend changing or stopping some of your medicines. HOME CARE INSTRUCTIONS  Have someone stay with you until you feel stable.  Do not drive, use machinery, or play sports until your health care provider says it is okay.  Keep all follow-up appointments as directed by your health care provider.  Lie down right away if you start feeling like you might faint. Breathe deeply and steadily. Wait until all the symptoms have passed.  Drink enough fluids to keep your urine clear or pale yellow.  If you are taking blood pressure or heart medicine, get up slowly and take several minutes to sit and then stand. This can reduce dizziness. SEEK IMMEDIATE MEDICAL CARE IF:   You have a severe headache.  You have unusual pain in the chest, abdomen, or back.  You are bleeding from your mouth or rectum, or you have black or tarry stool.  You have an irregular or very fast heartbeat.  You have pain with breathing.  You have repeated fainting or seizure-like  jerking during an episode.  You faint when sitting or lying down.  You have confusion.  You have trouble walking.  You have severe weakness.  You have vision problems. If you fainted, call your local emergency services (911 in [place] Do not drive yourself to the hospital.    This information is not intended to replace advice given to you by your health care provider. Make sure you discuss any questions you have with your health care provider.   Document Released: 04/16/2005 Document Revised: 08/31/2014 Document Reviewed: 06/15/2011 Elsevier Interactive Patient Education [employer]2016 Elsevier Inc.

## 2015-07-22 NOTE — ED Notes (Signed)
Pt arrives from [facility] via [facility] reporting cough since Feb. With syncopal episode Wednesday pm.  Pt reports coming to lying on floor, abrasion noted to forehead.  Pt denies taking blood thinners.  EMS reports pt in afib, has hx of same.  NAD noted at this time.

## 2015-07-22 NOTE — ED Notes (Signed)
Pt oob to br with steady gait 

## 2015-07-22 NOTE — Progress Notes (Addendum)
By signing my name below, I, Raven Small, attest that this documentation has been prepared under the direction and in the presence of Lesle Chris, MD.  Electronically Signed: Andrew Au, ED Scribe. 07/22/2015. 2:42 PM.  Chief Complaint:  Chief Complaint  Patient presents with  . Cough    x 5 weeks  . Head Injury    x 2 days ago, per patient he was coughing so hard and passed out hit head   HPI: William Dodson is a 65 y.o. male who reports to [facility] today complaining of persistent productive cough consisting of white/clear phlegm that began 5 weeks. Pt states 2 night ago while in [place] he had a coughing fit causing him to pass out and hit his head on a leather ottoman. He now has a bruise over right eye and a laceration. He was alone and is unable to recall how long he passed out. He notes trouble breathing and catching his breath with coughing fits.  He denies reflux. He reports hx of heart arrhythmia and had an atrial ablation surgery in 1997.   He denies wheezing.  Past Medical History  Diagnosis Date  . Sleep apnea     recent sleep study, results pending  . Myalgia   . Atrial fibrillation (HCC)     persistent,  s/p PVI by Dr Delena Serve at [facility]  . 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(HCC)     "heart infection" in setting of pneumonia 1993   Past Surgical History  Procedure Laterality Date  . Atrial ablation surgery  1999    afib ablation 1990s at [facility] by Ronn Melena   Social History   Social History  . Marital Status: Single    Spouse Name: N/A  . Number of Children: N/A  . Years of Education: N/A   Occupational History  . Sculptor    Social History Main Topics  . Smoking status: Never Smoker   . Smokeless tobacco: Never Used     Comment: tried smoking 30 years ago  . Alcohol Use: 3.6 oz/week    6 Glasses of wine per week     Comment: 1 glass of wine at night  . Drug Use: No  . Sexual Activity: Not Asked   Other Topics Concern  . 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 Diabetes Mother   . Cancer Mother   . Mental illness Mother   . Heart disease Father   . Hyperlipidemia Father   . Mental illness Brother   . Diabetes Maternal Grandmother   . Heart disease Paternal Grandmother    Allergies  Allergen Reactions  . Doxycycline     Itching   . Hydrocodone     Itching   . Septra [Bactrim]     Rash    Prior to Admission medications   Medication Sig Start Date End Date Taking? Authorizing Provider  co-enzyme Q-10 30 MG capsule Take 30 mg by mouth daily.     Yes Historical Provider, MD  NON FORMULARY Vitamin E 1 tab po qd    Yes Historical Provider, MD  NON FORMULARY Garlic 1 tab po qd    Yes Historical Provider, MD  Omega-3 Fatty Acids (FISH OIL PO) Take by mouth daily.     Yes Historical  Provider, MD     ROS: The patient denies fevers, chills, night sweats, unintentional weight loss, chest pain, palpitations, wheezing, dyspnea on exertion, nausea, vomiting, abdominal pain, dysuria, hematuria, melena, numbness, weakness, or tingling.  All other systems have been reviewed and were otherwise negative with the exception of those mentioned in the HPI and as above.    PHYSICAL EXAM: Filed Vitals:   07/22/15 1427  BP: 106/78  Pulse: 98  Temp: 98.7 F (37.1 C)  Resp: 16   Body mass index is 39.08 kg/(m^2).   General: Alert, no acute distress HEENT:  Normocephalic, atraumatic, oropharynx patent. Eye: [facility] Cardiovascular: Irregular rate and rhythm, no rubs murmurs or gallops.  No Carotid bruits, radial pulse intact. No pedal edema.  Respiratory: Clear to auscultation bilaterally.  No wheezes, rales, or rhonchi.  No cyanosis, no use of accessory musculature Abdominal: No organomegaly, abdomen is soft and non-tender, positive bowel sounds.  No masses. Musculoskeletal: Gait  intact. No edema, tenderness Skin: There is a 2 x 6 cm abrasion over the left with mild left upper lid swelling Neurologic: Facial musculature symmetric. Psychiatric: Patient acts appropriately throughout our interaction. Lymphatic: No cervical or submandibular lymphadenopathy    LABS: Results for orders placed or performed in visit on 07/22/15  POCT CBC  Result Value Ref Range   WBC 6.4 4.6 - 10.2 K/uL   Lymph, poc 3.0 0.6 - 3.4   POC LYMPH PERCENT 47.0 10 - 50 %L   MID (cbc) 0.6 0 - 0.9   POC MID % 9.1 0 - 12 %M   POC Granulocyte 2.8 2 - 6.9   Granulocyte percent 43.9 37 - 80 %G   RBC 4.89 4.69 - 6.13 M/uL   Hemoglobin 16.4 14.1 - 18.1 g/dL   HCT, POC 16.144.0 09.643.5 - 53.7 %   MCV 90.0 80 - 97 fL   MCH, POC 33.6 (A) 27 - 31.2 pg   MCHC 37.4 (A) 31.8 - 35.4 g/dL   RDW, POC 04.514.1 %   Platelet Count, POC 154 142 - 424 K/uL   MPV 7.6 0 - 99.8 fL      EKG/XRAY:   EKG: atrial fibrillation. Controlled rates.  Dg Chest 2 View  07/22/2015  CLINICAL DATA:  Shortness of breath ( EXAM: CHEST  2 VIEW COMPARISON:  12/19/2010 FINDINGS: Normal mediastinum and cardiac silhouette. Normal pulmonary vasculature. No evidence of effusion, infiltrate, or pneumothorax. No acute bony abnormality. IMPRESSION: No acute cardiopulmonary process. Electronically Signed   By: Genevive BiStewart  Edmunds M.D.   On: 07/22/2015 15:15    ASSESSMENT/PLAN: Couple days history. Patient has had a persistent cough for 5 weeks. Not ill with cough. Wednesday night in [place] while laying in bed he had a severe coughing episode fell from bed struck his head and was unconscious for some unknown period of time. Today he presents with his cough persistent for 5 weeks associated with a severe left-sided frontal headache. On the monitor he is in atrial fibrillation with controlled rate. He has a history of atrial fib with ablation in the 90s. Pertussis titers were done. He is transported to the hospital on the monitor with his history of  syncope, atrial fibrillation, and head injury.I personally performed the services described in this documentation, which was scribed in my presence. The recorded information has been reviewed and is accurate. Were positive. We'll discuss with ID and decide on next step.   Gross sideeffects, risk and benefits, and alternatives of medications d/w patient. Patient is  aware that all medications have potential sideeffects and we are unable to predict every sideeffect or drug-drug interaction that may occur.  Lesle Chris MD 07/22/2015 2:37 PM

## 2015-07-22 NOTE — ED Notes (Signed)
MD at bedside. 

## 2015-07-28 ENCOUNTER — Telehealth: Payer: Self-pay | Admitting: Emergency Medicine

## 2015-07-28 DIAGNOSIS — A3790 Whooping cough, unspecified species without pneumonia: Secondary | ICD-10-CM

## 2015-07-28 LAB — BORDETELLA PERTUSSIS AB IGG,IGA
FHA IgA: 232 IU/mL — ABNORMAL HIGH
FHA IgG: 786 IU/mL — ABNORMAL HIGH
PT IgA: 30 IU/mL — ABNORMAL HIGH
PT IgG: 384 IU/mL — ABNORMAL HIGH

## 2015-07-28 NOTE — Telephone Encounter (Signed)
Call patient and let him know his serum titers were positive for pertussis. This would explain his severe episodes of cough. I'm going to discuss this with ID tomorrow and decide on treatment.

## 2015-07-28 NOTE — Telephone Encounter (Signed)
Please call patient. Check on status. Be sure we are in process of his referral back to a cardiologist. He needs to see a cardiologist regarding his recurrent atrial fibrillation.

## 2015-07-28 NOTE — Addendum Note (Signed)
Addended by: Lesle ChrisAUB, STEVEN A on: 07/28/2015 05:16 AM   Modules accepted: Orders

## 2015-07-29 ENCOUNTER — Telehealth: Payer: Self-pay | Admitting: Emergency Medicine

## 2015-07-29 MED ORDER — AZITHROMYCIN 250 MG PO TABS: ORAL_TABLET | ORAL | Status: DC

## 2015-07-29 NOTE — Telephone Encounter (Signed)
I have called in a Zpak for treatment.

## 2015-08-03 ENCOUNTER — Ambulatory Visit (INDEPENDENT_AMBULATORY_CARE_PROVIDER_SITE_OTHER): Payer: BLUE CROSS/BLUE SHIELD | Admitting: Urgent Care

## 2015-08-03 VITALS — BP 134/76 | HR 85 | Temp 98.7°F | Resp 16 | Ht 64.0 in | Wt 225.3 lb

## 2015-08-03 DIAGNOSIS — R05 Cough: Secondary | ICD-10-CM

## 2015-08-03 DIAGNOSIS — I4891 Unspecified atrial fibrillation: Secondary | ICD-10-CM | POA: Diagnosis not present

## 2015-08-03 DIAGNOSIS — R059 Cough, unspecified: Secondary | ICD-10-CM

## 2015-08-03 DIAGNOSIS — A3790 Whooping cough, unspecified species without pneumonia: Secondary | ICD-10-CM

## 2015-08-03 MED ORDER — BENZONATATE 100 MG PO CAPS: 100.0000 mg | ORAL_CAPSULE | Freq: Three times a day (TID) | ORAL | Status: DC | PRN

## 2015-08-03 NOTE — Patient Instructions (Addendum)
Atrial Fibrillation Atrial fibrillation is a type of irregular or rapid heartbeat (arrhythmia). In atrial fibrillation, the heart quivers continuously in a chaotic pattern. This occurs when parts of the heart receive disorganized signals that make the heart unable to pump blood normally. This can increase the risk for stroke, heart failure, and other heart-related conditions. There are different types of atrial fibrillation, including:  Paroxysmal atrial fibrillation. This type starts suddenly, and it usually stops on its own shortly after it starts.  Persistent atrial fibrillation. This type often lasts longer than a week. It may stop on its own or with treatment.  Long-lasting persistent atrial fibrillation. This type lasts longer than 12 months.  Permanent atrial fibrillation. This type does not go away. Talk with your health care provider to learn about the type of atrial fibrillation that you have. CAUSES This condition is caused by some heart-related conditions or procedures, including:  A heart attack.  Coronary artery disease.  Heart failure.  Heart valve conditions.  High blood pressure.  Inflammation of the sac that surrounds the heart (pericarditis).  Heart surgery.  Certain heart rhythm disorders, such as Wolf-Parkinson-White syndrome. Other causes include:  Pneumonia.  Obstructive sleep apnea.  Blockage of an artery in the lungs (pulmonary embolism, or PE).  Lung cancer.  Chronic lung disease.  Thyroid problems, especially if the thyroid is overactive (hyperthyroidism).  Caffeine.  Excessive alcohol use or illegal drug use.  Use of some medicines, including certain decongestants and diet pills. Sometimes, the cause cannot be found. RISK FACTORS This condition is more likely to develop in:  People who are older in age.  People who smoke.  People who have diabetes mellitus.  People who are overweight (obese).  Athletes who exercise  vigorously. SYMPTOMS Symptoms of this condition include:  A feeling that your heart is beating rapidly or irregularly.  A feeling of discomfort or pain in your chest.  Shortness of breath.  Sudden light-headedness or weakness.  Getting tired easily during exercise. In some cases, there are no symptoms. DIAGNOSIS Your health care provider may be able to detect atrial fibrillation when taking your pulse. If detected, this condition may be diagnosed with:  An electrocardiogram (ECG).  A Holter monitor test that records your heartbeat patterns over a 24-hour period.  Transthoracic echocardiogram (TTE) to evaluate how blood flows through your heart.  Transesophageal echocardiogram (TEE) to view more detailed images of your heart.  A stress test.  Imaging tests, such as a CT scan or chest X-ray.  Blood tests. TREATMENT The main goals of treatment are to prevent blood clots from forming and to keep your heart beating at a normal rate and rhythm. The type of treatment that you receive depends on many factors, such as your underlying medical conditions and how you feel when you are experiencing atrial fibrillation. This condition may be treated with:  Medicine to slow down the heart rate, bring the heart's rhythm back to normal, or prevent clots from forming.  Electrical cardioversion. This is a procedure that resets your heart's rhythm by delivering a controlled, low-energy shock to the heart through your skin.  Different types of ablation, such as catheter ablation, catheter ablation with pacemaker, or surgical ablation. These procedures destroy the heart tissues that send abnormal signals. When the pacemaker is used, it is placed under your skin to help your heart beat in a regular rhythm. HOME CARE INSTRUCTIONS  Take over-the counter and prescription medicines only as told by your health care provider.    If your health care provider prescribed a blood-thinning medicine  (anticoagulant), take it exactly as told. Taking too much blood-thinning medicine can cause bleeding. If you do not take enough blood-thinning medicine, you will not have the protection that you need against stroke and other problems.  Do not use tobacco products, including cigarettes, chewing tobacco, and e-cigarettes. If you need help quitting, ask your health care provider.  If you have obstructive sleep apnea, manage your condition as told by your health care provider.  Do not drink alcohol.  Do not drink beverages that contain caffeine, such as coffee, soda, and tea.  Maintain a healthy weight. Do not use diet pills unless your health care provider approves. Diet pills may make heart problems worse.  Follow diet instructions as told by your health care provider.  Exercise regularly as told by your health care provider.  Keep all follow-up visits as told by your health care provider. This is important. PREVENTION  Avoid drinking beverages that contain caffeine or alcohol.  Avoid certain medicines, especially medicines that are used for breathing problems.  Avoid certain herbs and herbal medicines, such as those that contain ephedra or ginseng.  Do not use illegal drugs, such as cocaine and amphetamines.  Do not smoke.  Manage your high blood pressure. SEEK MEDICAL CARE IF:  You notice a change in the rate, rhythm, or strength of your heartbeat.  You are taking an anticoagulant and you notice increased bruising.  You tire more easily when you exercise or exert yourself. SEEK IMMEDIATE MEDICAL CARE IF:  You have chest pain, abdominal pain, sweating, or weakness.  You feel nauseous.  You notice blood in your vomit, bowel movement, or urine.  You have shortness of breath.  You suddenly have swollen feet and ankles.  You feel dizzy.  You have sudden weakness or numbness of the face, arm, or leg, especially on one side of the body.  You have trouble speaking,  trouble understanding, or both (aphasia).  Your face or your eyelid droops on one side. These symptoms may represent a serious problem that is an emergency. Do not wait to see if the symptoms will go away. Get medical help right away. Call your local emergency services (911 in the [place] Do not drive yourself to the hospital.   This information is not intended to replace advice given to you by your health care provider. Make sure you discuss any questions you have with your health care provider.   Document Released: 04/16/2005 Document Revised: 01/05/2015 Document Reviewed: 08/11/2014 Elsevier Interactive Patient Education [employer]     , Adult Pertussis (whooping cough) is an infection that causes severe and sudden coughing attacks. CAUSES  Pertussis is caused by bacteria. It is very contagious and spreads to others by the droplets sprayed in the air when an infected person talks, coughs, and sneezes. You may have caught pertussis from inhaling these droplets or from touching a surface where the droplets fell and then touching your mouth or nose. SIGNS AND SYMPTOMS  Early during this infection, symptoms of pertussis are similar to those of the common cold. They include a runny nose, low fever, mild cough, and red, watery eyes. After 1-2 weeks the cold symptoms get better, but the cough worsens and severe and sudden coughing attacks frequently develop. During these attacks people may cough so hard that vomiting occurs. Over the next month to 6 weeks, the cough starts to get better, but it may take as long as 6  months for the cough to go away completely. DIAGNOSIS  Your health care provider will perform a physical exam. The health care provider may take a mucus sample from the nose and throat and a blood sample to help confirm the diagnosis. The health care provider may also take a chest X-ray. TREATMENT  Antibiotic medicines are usually prescribed for this infection. Starting  antibiotics quickly may help shorten the illness and make it less contagious. Antibiotics may also be prescribed for everyone living in the same household. Immunization may be recommended for those in the household at risk of developing pertussis. At-risk groups include:  Infants.  Those who have not had their full course of pertussis immunizations.  Those who were immunized but have not had their recent booster shot. Mild coughing may continue for months after the infection is treated from the remaining soreness and inflammation in the lungs. HOME CARE INSTRUCTIONS   If you were prescribed an antibiotic medicine, finish it all even if you start to feel better.  Do not take cough medicine unless prescribed by your health care provider. Coughing is a protective mechanism that helps keep colored mucus (sputum) and secretions from clogging breathing passages.  Stay away from those who are at risk of developing pertussis for the first 5 days of antibiotic treatment. If no antibiotics are prescribed, stay at home for the first 3 weeks you are coughing.  Do not go to work until you have been treated with antibiotics for 5 days. If no antibiotics are prescribed, do not go to work for the first 3 weeks you are coughing. Inform your workplace that you were diagnosed with pertussis.  Wash your hands often. Those living in the same household should also wash their hands often to avoid spreading the infection.  Avoid substances that may irritate the lungs, such as smoke, aerosols, or fumes. These substances may worsen your coughing.  If you are having a coughing attack:  Raise the head of your mattress to help clear sputum more easily and improve breathing.  Sit upright.  Use a cool mist humidifier at home to increase air moisture. This will soothe your cough and help loosen sputum. Do not use hot steam.  Rest as much as possible. Normal activity may be gradually resumed.  Drink enough fluids to  keep your urine clear or pale yellow. PREVENTION  Pertussis can be prevented with a vaccine and later booster shots. The pertussis vaccine is usually given during childhood. Adults who were not previously vaccinated should be vaccinated as soon as possible. Adults who were previously vaccinated should talk to their health care providers about the need for a booster shot because immunity from the vaccine decreases over time. All of the following persons should consider receiving a booster dose of pertussis, which is combined with tetanus and diphtheria (Tdap) vaccine:  Pregnant women during each pregnancy, preferably at 27-36 weeks of pregnancy (gestation).  All persons who have or will have close contact with an infant aged less than 12 months. Infants are at highest risk for life-threatening complications from pertussis.  All health care personnel. SEEK MEDICAL CARE IF:   You have persistent vomiting.  You are not able to eat or drink fluids.  You do not seem to be improving.  You have a fever. SEEK IMMEDIATE MEDICAL CARE IF:   Your face turns red or blue during a coughing attack.  You become unconscious after a coughing attack, even if only for a few moments.  Your breathing  stops for a period of time (apnea).  You are restless or cannot sleep.  You are listless or sleeping too much. MAKE SURE YOU:  Understand these instructions.   Will watch your condition.   Will get help right away if you are not doing well or get worse.   This information is not intended to replace advice given to you by your health care provider. Make sure you discuss any questions you have with your health care provider.   Document Released: 08/11/2012 Document Revised: 05/07/2014 Document Reviewed: 08/11/2012 Elsevier Interactive Patient Education [employer]

## 2015-08-03 NOTE — Progress Notes (Signed)
    MRN: [id number] DOB: 12-09-1950  Subjective:   William Dodson is a 65 y.o. male presenting for Follow-up  Patient was seen on 07/22/2015 for 5 week history of cough. He was referred to ED, subsequent labs showed positive titers for pertussis. Dr. Cleta Albertsaub had patient start azithromycin. He presents today for f/u. He notes significant improvement in his cough but it has not yet resolved. He denies fever, chest pain, shob, post-tussive emesis. He occasionally wakes up at night due to his cough. Also has a long-standing history of atrial fibrillation. He has received calls to schedule a consult with cardiology but has not followed up.  William Dodson has a current medication list which includes the following prescription(s): arginine, ascorbic acid, b complex vitamins, cholecalciferol, chromium picolinate, garlic, ibuprofen, iodine (kelp), krill oil, magnesium, menaquinone-7, milk thistle, saw palmetto, throat lozenges, ubiquinol, vitamin a, azithromycin, and guaifenesin. Also is allergic to morphine and related; doxycycline; hydrocodone; and septra.  William Dodson  has a past medical history of Sleep apnea; Myalgia; Atrial fibrillation (HCC); and 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(HCC). Also  has past surgical history that includes Atrial ablation surgery (1999).  Objective:   Vitals: BP 134/76 mmHg  Pulse 85  Temp(Src) 98.7 F (37.1 C) (Oral)  Resp 16  Ht 5\' 4"  (1.626 m)  Wt 225 lb 4.8 oz (102.195 kg)  BMI 38.65 kg/m2  SpO2 98%  Physical Exam  Constitutional: He is oriented to person, place, and time. He appears well-developed and well-nourished.  HENT:  Mouth/Throat: Oropharynx is clear and moist.  Eyes: No scleral icterus.  Cardiovascular: Normal rate, regular rhythm and intact distal pulses.  Exam reveals no gallop and no friction rub.   No murmur heard. Pulmonary/Chest: No respiratory distress. He has no wheezes. He has no rales.  Neurological: He is alert and oriented to person, place, and time.  Skin: Skin is warm  and dry.   Assessment and Plan :   1. Atrial fibrillation, unspecified type [facility](HCC) - Ambulatory referral to Cardiology  2. Pertussis 3. Cough - Improved, offered Tessalon pearls for cough suppression. RTC as needed.  William BambergMario Keyonta Barradas, PA-C [facility] [phone] 08/03/2015 2:35 PM

## 2015-08-11 NOTE — Progress Notes (Signed)
Electrophysiology Office Note   Date:  08/12/2015   ID:  William Dodson, DOB [birth year], MRN [id number]  PCP:  William Pearson, MD  Primary Electrophysiologist:  William Lemming, MD    Chief Complaint  Patient presents with  . Atrial Fibrillation     History of Present Illness: William Dodson is a 65 y.o. male who presents today for electrophysiology evaluation.    He has a history of sleep apnea, atrial fibrillation status post PVI [facility] in 1997 by Dr. 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  Today, he feels well. He does not complain of shortness of breath or fatigue. He says that he knows that he is in atrial fibrillation because he checks his pulse from time to time.  Today, he denies symptoms of palpitations, chest pain, shortness of breath, orthopnea, PND, lower extremity edema, claudication, dizziness, presyncope, syncope, bleeding, or neurologic sequela. The patient is tolerating medications without difficulties and is otherwise without complaint today.    Past Medical History  Diagnosis Date  . Sleep apnea     recent sleep study, results pending  . Myalgia   . Atrial fibrillation (HCC)     persistent,  s/p PVI by Dr William Dodson at [facility]  . 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(HCC)     "heart infection" in setting of pneumonia 1993   Past Surgical History  Procedure Laterality Date  . Atrial ablation surgery  1999    afib ablation 1990s at [facility] by William Dodson     Current Outpatient Prescriptions  Medication Sig Dispense Refill  . ARGININE PO Take 1 tablet by mouth daily as needed (FEW TIMES WEEKLY).    . Ascorbic Acid (VITAMIN C PO) Take 1 tablet by mouth daily as needed (FEW TIMES WEEKLY).    Marland Kitchen b complex vitamins tablet Take 1 tablet by mouth daily as needed (FEW TIMES WEEKLY).    Marland Kitchen benzonatate (TESSALON) 100 MG capsule Take 1-2 capsules (100-200 mg total) by mouth 3 (three) times daily as needed for cough. 40 capsule 0  . Cholecalciferol (VITAMIN  D PO) Take 1 tablet by mouth daily as needed (FEW TIMES WEEKLY).    . Chromium Picolinate (CHROMIUM PICOLATE PO) Take 1 tablet by mouth daily as needed (FEW TIMES WEEKLY).    Marland Kitchen GARLIC OIL PO Take 1 tablet by mouth daily as needed (FEW TIMES WEEKLY).    Marland Kitchen guaifenesin (ROBITUSSIN) 100 MG/5ML syrup Take 200 mg by mouth 3 (three) times daily as needed for cough. Reported on 08/03/2015    . ibuprofen (ADVIL,MOTRIN) 200 MG tablet Take 400 mg by mouth every 6 (six) hours as needed for moderate pain.    . IODINE, KELP, PO Take 1 tablet by mouth daily as needed (FEW TIMES WEEKLY).    Marland Kitchen KRILL OIL PO Take 1 capsule by mouth daily as needed (FEW TIMES WEEKLY).    Marland Kitchen MAGNESIUM PO Take 1 tablet by mouth daily as needed (FEW TIMES WEEKLY).     . Menaquinone-7 (VITAMIN K2 PO) Take 1 tablet by mouth daily as needed (FEW TIMES WEEKLY).    Marland Kitchen MILK THISTLE PO Take 1 tablet by mouth daily as needed (FEW TIMES WEEKLY).    . Misc Natural Products (SAW PALMETTO) CAPS Take 1 capsule by mouth daily as needed (FEW TIMES WEEKLY).    . Throat Lozenges (COUGH DROPS MT) Use as directed 1 lozenge in the mouth or throat every 2 (two) hours as needed (for cough).    Marland Kitchen  Ubiquinol 100 MG CAPS Take 100 mg by mouth daily as needed (FEW TIMES WEEKLY).    Marland Kitchen VITAMIN A PO Take 1 capsule by mouth daily as needed (FEW TIMES WEEKLY).      No current facility-administered medications for this visit.    Allergies:   Morphine and related; Doxycycline; Hydrocodone; and Septra   Social History:  The patient  reports that he has never smoked. He has never used smokeless tobacco. He reports that he drinks about 3.6 oz of alcohol per week. He reports that he does not use illicit drugs.   Family History:  The patient's family history includes Cancer in his mother; Diabetes in his maternal grandmother and mother; Heart disease in his father and paternal grandmother; Hyperlipidemia in his father; Mental illness in his brother and mother.    ROS:   Please see the history of present illness.   Otherwise, review of systems is positive for palpitations.   All other systems are reviewed and negative.    PHYSICAL EXAM: VS:  BP 114/82 mmHg  Pulse 84  Ht  (1.626 m)  Wt 227 lb 9.6 oz (103.239 kg)  BMI 39.05 kg/m2 , BMI Body mass index is 39.05 kg/(m^2). GEN: Well nourished, well developed, in no acute distress HEENT: normal Neck: no JVD, carotid bruits, or masses Cardiac: Irregular; no murmurs, rubs, or gallops,no edema  Respiratory:  clear to auscultation bilaterally, normal work of breathing, GI: soft, nontender, nondistended, + BS MS: no deformity or atrophy Skin: warm and dry Neuro:  Strength and sensation are intact Psych: euthymic mood, full affect  EKG:  EKG is ordered today. The ekg ordered today shows atrial fibrillation, rate 84. She is she her Lasix 20 mg daily, today couple, now edema is 20  Recent Labs: 07/22/2015: BUN 18; Creatinine, Ser 0.90; Hemoglobin 17.0; Potassium 3.7; Sodium 141    Lipid Panel     Component Value Date/Time   CHOL 206* 08/12/2013 1632   TRIG 105 08/12/2013 1632   HDL 53 08/12/2013 1632   CHOLHDL 3.9 08/12/2013 1632   VLDL 21 08/12/2013 1632   LDLCALC 132* 08/12/2013 1632     Wt Readings from Last 3 Encounters:  08/12/15 227 lb 9.6 oz (103.239 kg)  08/03/15 225 lb 4.8 oz (102.195 kg)  07/22/15 225 lb (102.059 kg)      Other studies Reviewed: Additional studies/ records that were reviewed today include: TTE 2012  Review of the above records today demonstrates:  - Left ventricle: The cavity size was normal. Wall thickness was increased in a pattern of mild LVH. Systolic function was mildly to moderately reduced. The estimated ejection fraction was in the range of 40% to 45%. Diffuse hypokinesis. The study is not technically sufficient to allow evaluation of LV diastolic function. - Aortic valve: Mild regurgitation. - Left atrium: The atrium was moderately to  severely dilated. - Right atrium: The atrium was mildly dilated.   ASSESSMENT AND PLAN:  1.  Atrial fibrillation: Has had a TTE in 2012 which showed a moderate to severely dilated left atrium. Due to the fact that it's been 5 years since his most recent TTE, we'll repeat this study today.In speaking with him, he does not have any symptoms, and therefore does not feel like he would like to try to be in normal rhythm. Due to his low CHADS2VASc score of 1, have elected not to anticoagulate. 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 He would like to follow-up in 6 months to further discuss management options. I have discussed with him the risk of stroke, which is 0.6% per year.  This patients CHA2DS2-VASc Score and unadjusted Ischemic Stroke Rate (% per year) is equal to 0.6 % stroke rate/year from a score of 1  Above score calculated as 1 point each if present [CHF, HTN, DM, Vascular=MI/PAD/Aortic Plaque, Age if 65-74, or Male] Above score calculated as 2 points each if present [Age > 75, or Stroke/TIA/TE]       Current medicines are reviewed at length with the patient today.   The patient does not have concerns regarding his medicines.  The following changes were made today:  none  Labs/ tests ordered today include:  Orders Placed This Encounter  Procedures  . EKG 12-Lead  . ECHOCARDIOGRAM COMPLETE     Disposition:   FU with [place] Chelf 6 months  Signed, Long Brimage Jorja LoaMartin Iyauna Sing, MD  08/12/2015 11:52 AM     [facility] [address] [place] [address] [phone] (office) [phone] (fax) (

## 2015-08-12 ENCOUNTER — Encounter: Payer: Self-pay | Admitting: Cardiology

## 2015-08-12 ENCOUNTER — Ambulatory Visit (INDEPENDENT_AMBULATORY_CARE_PROVIDER_SITE_OTHER): Payer: BLUE CROSS/BLUE SHIELD | Admitting: Cardiology

## 2015-08-12 VITALS — BP 114/82 | HR 84 | Ht 64.0 in | Wt 227.6 lb

## 2015-08-12 DIAGNOSIS — I4891 Unspecified atrial fibrillation: Secondary | ICD-10-CM | POA: Diagnosis not present

## 2015-08-12 NOTE — Patient Instructions (Signed)
Medication Instructions:  Your physician recommends that you continue on your current medications as directed. Please refer to the Current Medication list given to you today.   Labwork: None ordered   Testing/Procedures: Your physician has requested that you have an echocardiogram. Echocardiography is a painless test that uses sound waves to create images of your heart. It provides your doctor with information about the size and shape of your heart and how well your heart's chambers and valves are working. This procedure takes approximately one hour. There are no restrictions for this procedure.    Follow-Up: Your physician wants you to follow-up in: 6 months with Dr Perrin Smackamnitz You will receive a reminder letter in the mail two months in advance. If you don't receive a letter, please call our office to schedule the follow-up appointment.   Any Other Special Instructions Will Be Listed Below (If Applicable).     If you need a refill on your cardiac medications before your next appointment, please call your pharmacy.

## 2015-08-23 ENCOUNTER — Ambulatory Visit (HOSPITAL_COMMUNITY): Payer: BLUE CROSS/BLUE SHIELD | Attending: Cardiology

## 2015-08-23 ENCOUNTER — Other Ambulatory Visit: Payer: Self-pay

## 2015-08-23 DIAGNOSIS — E785 Hyperlipidemia, unspecified: Secondary | ICD-10-CM | POA: Insufficient documentation

## 2015-08-23 DIAGNOSIS — I77810 Thoracic aortic ectasia: Secondary | ICD-10-CM | POA: Insufficient documentation

## 2015-08-23 DIAGNOSIS — E669 Obesity, unspecified: Secondary | ICD-10-CM | POA: Insufficient documentation

## 2015-08-23 DIAGNOSIS — I351 Nonrheumatic aortic (valve) insufficiency: Secondary | ICD-10-CM | POA: Insufficient documentation

## 2015-08-23 DIAGNOSIS — Z87891 Personal history of nicotine dependence: Secondary | ICD-10-CM | POA: Diagnosis not present

## 2015-08-23 DIAGNOSIS — I4891 Unspecified atrial fibrillation: Secondary | ICD-10-CM | POA: Insufficient documentation

## 2015-08-23 DIAGNOSIS — I340 Nonrheumatic mitral (valve) insufficiency: Secondary | ICD-10-CM | POA: Insufficient documentation

## 2015-08-23 DIAGNOSIS — I517 Cardiomegaly: Secondary | ICD-10-CM | POA: Insufficient documentation

## 2015-08-23 DIAGNOSIS — Z6839 Body mass index (BMI) 39.0-39.9, adult: Secondary | ICD-10-CM | POA: Insufficient documentation

## 2015-10-03 ENCOUNTER — Other Ambulatory Visit: Payer: Self-pay | Admitting: Gastroenterology

## 2016-05-07 DIAGNOSIS — H04123 Dry eye syndrome of bilateral lacrimal glands: Secondary | ICD-10-CM | POA: Diagnosis not present

## 2016-05-07 DIAGNOSIS — H35363 Drusen (degenerative) of macula, bilateral: Secondary | ICD-10-CM | POA: Diagnosis not present

## 2016-05-07 DIAGNOSIS — H2513 Age-related nuclear cataract, bilateral: Secondary | ICD-10-CM | POA: Diagnosis not present

## 2016-05-07 DIAGNOSIS — H43813 Vitreous degeneration, bilateral: Secondary | ICD-10-CM | POA: Diagnosis not present

## 2016-07-07 ENCOUNTER — Emergency Department (HOSPITAL_COMMUNITY): Payer: BLUE CROSS/BLUE SHIELD

## 2016-07-07 ENCOUNTER — Encounter (HOSPITAL_COMMUNITY): Payer: Self-pay | Admitting: Emergency Medicine

## 2016-07-07 ENCOUNTER — Emergency Department (HOSPITAL_COMMUNITY)
Admission: EM | Admit: 2016-07-07 | Discharge: 2016-07-07 | Disposition: A | Discharge status: Home / Self Care | Payer: BLUE CROSS/BLUE SHIELD | Attending: Emergency Medicine | Admitting: Emergency Medicine

## 2016-07-07 DIAGNOSIS — K760 Fatty (change of) liver, not elsewhere classified: Secondary | ICD-10-CM | POA: Diagnosis not present

## 2016-07-07 DIAGNOSIS — K439 Ventral hernia without obstruction or gangrene: Secondary | ICD-10-CM

## 2016-07-07 DIAGNOSIS — R109 Unspecified abdominal pain: Secondary | ICD-10-CM | POA: Diagnosis present

## 2016-07-07 DIAGNOSIS — K429 Umbilical hernia without obstruction or gangrene: Secondary | ICD-10-CM | POA: Insufficient documentation

## 2016-07-07 LAB — COMPREHENSIVE METABOLIC PANEL
ALK PHOS: 56 U/L (ref 38–126)
ALT: 43 U/L (ref 17–63)
AST: 41 U/L (ref 15–41)
Albumin: 4.4 g/dL (ref 3.5–5.0)
Anion gap: 11 (ref 5–15)
BUN: 13 mg/dL (ref 6–20)
CALCIUM: 9.3 mg/dL (ref 8.9–10.3)
CO2: 24 mmol/L (ref 22–32)
CREATININE: 1.03 mg/dL (ref 0.61–1.24)
Chloride: 105 mmol/L (ref 101–111)
GFR calc non Af Amer: >60 mL/min (ref >60)
GLUCOSE: 125 mg/dL — AB (ref 65–99)
Potassium: 4 mmol/L (ref 3.5–5.1)
SODIUM: 140 mmol/L (ref 135–145)
Total Bilirubin: 0.5 mg/dL (ref 0.3–1.2)
Total Protein: 7.6 g/dL (ref 6.5–8.1)

## 2016-07-07 LAB — URINALYSIS, ROUTINE W REFLEX MICROSCOPIC
BILIRUBIN URINE: NEGATIVE
GLUCOSE, UA: NEGATIVE mg/dL
HGB URINE DIPSTICK: NEGATIVE
KETONES UR: NEGATIVE mg/dL
Leukocytes, UA: NEGATIVE
Nitrite: NEGATIVE
Protein, ur: NEGATIVE mg/dL
Specific Gravity, Urine: 1.028 (ref 1.005–1.030)
pH: 7 (ref 5.0–8.0)

## 2016-07-07 LAB — CBC
HCT: 48 % (ref 39.0–52.0)
Hemoglobin: 16.6 g/dL (ref 13.0–17.0)
MCH: 31.7 pg (ref 26.0–34.0)
MCHC: 34.6 g/dL (ref 30.0–36.0)
MCV: 91.6 fL (ref 78.0–100.0)
PLATELETS: 188 10*3/uL (ref 150–400)
RBC: 5.24 MIL/uL (ref 4.22–5.81)
RDW: 13.9 % (ref 11.5–15.5)
WBC: 6.3 10*3/uL (ref 4.0–10.5)

## 2016-07-07 LAB — LIPASE, BLOOD: Lipase: 19 U/L (ref 11–51)

## 2016-07-07 LAB — I-STAT CG4 LACTIC ACID, ED: LACTIC ACID, VENOUS: 3.05 mmol/L — AB (ref 0.5–1.9)

## 2016-07-07 MED ORDER — SODIUM CHLORIDE 0.9 % IV BOLUS (SEPSIS)
1000.0000 mL | Freq: Once | INTRAVENOUS | Status: AC
  Administered 2016-07-07: 1000 mL via INTRAVENOUS

## 2016-07-07 MED ORDER — ONDANSETRON HCL 4 MG/2ML IJ SOLN
4.0000 mg | Freq: Once | INTRAMUSCULAR | Status: AC
  Administered 2016-07-07: 4 mg via INTRAVENOUS
  Filled 2016-07-07: qty 2

## 2016-07-07 MED ORDER — FENTANYL CITRATE (PF) 100 MCG/2ML IJ SOLN
100.0000 ug | Freq: Once | INTRAMUSCULAR | Status: AC
  Administered 2016-07-07: 100 ug via INTRAVENOUS
  Filled 2016-07-07: qty 2

## 2016-07-07 MED ORDER — IOPAMIDOL (ISOVUE-300) INJECTION 61%
INTRAVENOUS | Status: AC
  Administered 2016-07-07: 100 mL
  Filled 2016-07-07: qty 100

## 2016-07-07 MED ORDER — MIDAZOLAM HCL 2 MG/2ML IJ SOLN
2.0000 mg | Freq: Once | INTRAMUSCULAR | Status: AC
  Administered 2016-07-07: 2 mg via INTRAVENOUS
  Filled 2016-07-07: qty 2

## 2016-07-07 NOTE — ED Notes (Signed)
Dr Clarene DukeLittle given a copy of lactic acid results 3.05

## 2016-07-07 NOTE — ED Triage Notes (Signed)
Pt made acuity 2 due to severity of pain and sudden onset of pain

## 2016-07-07 NOTE — ED Notes (Signed)
Patient transported to CT 

## 2016-07-07 NOTE — ED Provider Notes (Signed)
[facility] DEPT Provider Note   CSN: [id number] Arrival date & time: 07/07/16 1640     History    Chief Complaint  Patient presents with  . Abdominal Pain     HPI William Dodson is a 66 y.o. male.  66yo M w/ PMH below who p/w abdominal pain. At approximately 2 PM today, the patient had a relatively sudden onset of central abdominal pain that has been constant since it began and is currently severe. Pain is worse with laying flat or standing up straight and with any movement or palpation of the area. A brief episode of associated nausea but has had no vomiting. No diarrhea or problems with bowel movements. No fevers or recent illness. No urinary symptoms. He has noticed in the past that he intermittently has a bulge in this area but it has not caused him pain like this.   Past Medical History:  Diagnosis Date  . Atrial fibrillation (HCC)    persistent,  s/p PVI by Dr Delena Serve at [facility]  . Myalgia   . 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(HCC)    "heart infection" in setting of pneumonia 1993  . Sleep apnea    recent sleep study, results pending     Patient Active Problem List   Diagnosis Date Noted  . Obstructive sleep apnea 06/28/2011  . Sleep apnea 01/29/2011  . 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 ONYCHOMYCOSIS 06/27/2006  . HYPERLIPIDEMIA 06/27/2006  . ATRIAL FIBRILLATION 06/27/2006    Past Surgical History:  Procedure Laterality Date  . ATRIAL ABLATION SURGERY  1999   afib ablation 1990s at [facility] by Ronn Melena        Home Medications    Prior to Admission medications   Medication Sig Start Date End Date Taking? Authorizing Provider  ARGININE PO Take 1 tablet by mouth daily as needed (FEW TIMES WEEKLY).   Yes Historical Provider, MD  Ascorbic Acid (VITAMIN C PO) Take 1 tablet by mouth daily as needed (FEW TIMES WEEKLY).   Yes Historical Provider, MD  b complex vitamins tablet Take 1 tablet by mouth daily as needed (FEW TIMES WEEKLY).   Yes  Historical Provider, MD  Cholecalciferol (VITAMIN D PO) Take 1 tablet by mouth daily as needed (FEW TIMES WEEKLY).   Yes Historical Provider, MD  Chromium Picolinate (CHROMIUM PICOLATE PO) Take 1 tablet by mouth daily as needed (FEW TIMES WEEKLY).   Yes Historical Provider, MD  GARLIC OIL PO Take 1 tablet by mouth daily as needed (FEW TIMES WEEKLY).   Yes Historical Provider, MD  IODINE, KELP, PO Take 1 tablet by mouth daily as needed (FEW TIMES WEEKLY).   Yes Historical Provider, MD  KRILL OIL PO Take 1 capsule by mouth daily as needed (FEW TIMES WEEKLY).   Yes Historical Provider, MD  MAGNESIUM PO Take 1 tablet by mouth daily as needed (FEW TIMES WEEKLY).    Yes Historical Provider, MD  Menaquinone-7 (VITAMIN K2 PO) Take 1 tablet by mouth daily as needed (FEW TIMES WEEKLY).   Yes Historical Provider, MD  MILK THISTLE PO Take 1 tablet by mouth daily as needed (FEW TIMES WEEKLY).   Yes Historical Provider, MD  Misc Natural Products (SAW PALMETTO) CAPS Take 1 capsule by mouth daily as needed (FEW TIMES WEEKLY).   Yes Historical Provider, MD  Ubiquinol 100 MG CAPS Take 100 mg by mouth daily as needed (FEW TIMES WEEKLY).   Yes Historical Provider, MD  VITAMIN A PO Take 1 capsule by mouth  daily as needed (FEW TIMES WEEKLY).    Yes Historical Provider, MD  benzonatate (TESSALON) 100 MG capsule Take 1-2 capsules (100-200 mg total) by mouth 3 (three) times daily as needed for cough. Patient not taking: Reported on 07/07/2016 08/03/15   Wallis Bamberg, PA-C      Family History  Problem Relation Age of Onset  . Diabetes Mother   . Cancer Mother   . Mental illness Mother   . Heart disease Father   . Hyperlipidemia Father   . Mental illness Brother   . Diabetes Maternal Grandmother   . Heart disease Paternal Grandmother      Social History  Substance Use Topics  . Smoking status: Never Smoker  . Smokeless tobacco: Never Used     Comment: tried smoking 30 years ago  . Alcohol use 3.6 oz/week    6  Glasses of wine per week     Comment: 1 glass of wine at night     Allergies     Morphine and related; Doxycycline; Hydrocodone; and Septra [bactrim]    Review of Systems  10 Systems reviewed and are negative for acute change except as noted in the HPI.   Physical Exam Updated Vital Signs BP 120/83   Pulse 92   Temp 98.1 F (36.7 C) (Oral)   Resp 18   SpO2 96%   Physical Exam  Constitutional: He is oriented to person, place, and time. He appears well-developed and well-nourished. He appears distressed.  Sitting on side of bed w/ arms across abd, in pain  HENT:  Head: Normocephalic and atraumatic.  Moist mucous membranes  Eyes: Conjunctivae are normal. Pupils are equal, round, and reactive to light.  Neck: Neck supple.  Cardiovascular: Normal rate, regular rhythm and normal heart sounds.   No murmur heard. Pulmonary/Chest: Effort normal and breath sounds normal.  Abdominal: Soft. Bowel sounds are normal. He exhibits no distension. There is tenderness.  Tenderness just above umbilicus w/ associated firm mass, no erythema or skin changes  Musculoskeletal: He exhibits no edema.  Neurological: He is alert and oriented to person, place, and time.  Fluent speech  Skin: Skin is warm and dry.  Psychiatric: He has a normal mood and affect. Judgment normal.  Nursing note and vitals reviewed.     ED Treatments / Results  Labs (all labs ordered are listed, but only abnormal results are displayed) Labs Reviewed  COMPREHENSIVE METABOLIC PANEL - Abnormal; Notable for the following:       Result Value   Glucose, Bld 125 (*)    All other components within normal limits  I-STAT CG4 LACTIC ACID, ED - Abnormal; Notable for the following:    Lactic Acid, Venous 3.05 (*)    All other components within normal limits  LIPASE, BLOOD  CBC  URINALYSIS, ROUTINE W REFLEX MICROSCOPIC     EKG  EKG Interpretation  Date/Time:    Ventricular Rate:    PR Interval:    QRS  Duration:   QT Interval:    QTC Calculation:   R Axis:     Text Interpretation:           Radiology Ct Abdomen Pelvis W Contrast  Result Date: 07/07/2016 CLINICAL DATA:  Umbilical pain starting today. EXAM: CT ABDOMEN AND PELVIS WITH CONTRAST TECHNIQUE: Multidetector CT imaging of the abdomen and pelvis was performed using the standard protocol following bolus administration of intravenous contrast. CONTRAST:  1 ISOVUE-300 IOPAMIDOL (ISOVUE-300) INJECTION 61% COMPARISON:  None. FINDINGS: Lower chest:  Mild right infrahilar lymphadenopathy is identified. Hepatobiliary: The liver shows diffusely decreased attenuation suggesting steatosis. There is no evidence for gallstones, gallbladder wall thickening, or pericholecystic fluid. No intrahepatic or extrahepatic biliary dilation. Pancreas: No focal mass lesion. No dilatation of the main duct. No intraparenchymal cyst. No peripancreatic edema. Spleen: No splenomegaly. No focal mass lesion. Adrenals/Urinary Tract: No adrenal nodule or mass. Kidneys are unremarkable. No evidence for hydroureter. The urinary bladder appears normal for the degree of distention. Stomach/Bowel: Stomach is nondistended. No gastric wall thickening. No evidence of outlet obstruction. Duodenum is normally positioned as is the ligament of Treitz. No small bowel wall thickening. No small bowel dilatation. The terminal ileum is normal. The appendix is normal. No gross colonic mass. No colonic wall thickening. No substantial diverticular change. Vascular/Lymphatic: There is abdominal aortic atherosclerosis without aneurysm. There is no gastrohepatic or hepatoduodenal ligament lymphadenopathy. No intraperitoneal or retroperitoneal lymphadenopathy. No pelvic sidewall lymphadenopathy. Reproductive: Prostate gland mildly enlarged. Other: Bone windows reveal no worrisome lytic or sclerotic osseous lesions. Small umbilical hernia contains only fat. There is no edema or fluid within the hernia  to suggest fatty incarceration. 3-4 cm cranial to the umbilicus is a second very tiny fascial defect in the midline through which fat herniates. Midline hernia sac measures 1.1 x 2.0 x 1.5 cm. No edema or fluid within the sac to suggest fat incarceration. Musculoskeletal: 1. No acute findings in the abdomen or pelvis. 2. Small umbilical hernia without complicating features. A second tiny midline ventral hernia seen just cranial to the umbilicus, also containing only fat without complicating features. 3. Mild right infrahilar lymphadenopathy. Dedicated CT chest with contrast recommended to further evaluate. 4.  Abdominal Aortic Atherosclerois (ICD10-170.0) 5. Hepatic steatosis Electronically Signed   By: Kennith Center M.D.   On: 07/07/2016 20:06    Procedures Procedures (including critical care time) Hernia reduction Date/Time: 07/07/2016 7:42 PM Performed by: Laurence Spates Authorized by: Laurence Spates  Consent: Verbal consent obtained. Consent given by: patient Patient identity confirmed: verbally with patient Local anesthesia used: no  Anesthesia: Local anesthesia used: no  Sedation: Patient sedated: no (fentanyl) Patient tolerance: Patient tolerated the procedure well with no immediate complications Comments: Applied steady gentle pressure on abdomen just above umbilicus, successful reduction of mass     Medications Ordered in ED  Medications  ondansetron (ZOFRAN) injection 4 mg (4 mg Intravenous Given 07/07/16 1750)  fentaNYL (SUBLIMAZE) injection 100 mcg (100 mcg Intravenous Given 07/07/16 1750)  midazolam (VERSED) injection 2 mg (2 mg Intravenous Given 07/07/16 1848)  iopamidol (ISOVUE-300) 61 % injection (100 mLs  Contrast Given 07/07/16 1909)  sodium chloride 0.9 % bolus 1,000 mL (1,000 mLs Intravenous New Bag/Given 07/07/16 2045)     Initial Impression / Assessment and Plan / ED Course  I have reviewed the triage vital signs and the nursing notes.  Pertinent  labs & imaging results that were available during my care of the patient were reviewed by me and considered in my medical decision making (see chart for details).     PTw/ sudden onset of severe constant pain just above umbilicus. On exam, he was in distress due to pain but w/ normal VS. Tender mass just above umbilicus, concerning for incarcerated hernia. Gave fentanyl and reduced at bedside with significant relief of pain. He did remain mildly tender in this area and reported mild ongoing pain. Labs show lactate 3, which was obtained prior to reduction. Because of mild tenderness after reduction w/ lactate, obtained CT  to ensure no entrapped or incarcerated loops of bowel.  All other lab work was reassuring. CT scan showed small umbilical hernia as well as small midline ventral hernia, neither with complicating features. No signs of obstruction. There was an incidental finding of right infrahilar lymphadenopathy with recommendation of outpatient CT of chest. I discussed findings with patient including the incidental finding. Instructed him to follow-up with PCP for outpatient chest CT regarding this finding. After receiving IV fluid bolus, patient was drinking fluids and well-appearing on reexamination. He stated that his pain was resolved. He has been observed after reduction with no recurrence of his symptoms therefore I feel he is safe for discharge. I have emphasized the importance of close follow-up with general surgery and extensively reviewed return precautions including any reoccurrence of his symptoms or vomiting/signs of obstruction. Also instructed on caution with physical activity including no heavy lifting. Patient voiced understanding and was discharged in satisfactory condition.  Final Clinical Impressions(s) / ED Diagnoses   Final diagnoses:  Ventral hernia without obstruction or gangrene  Umbilical hernia without obstruction and without gangrene     New Prescriptions   No  medications on file       Laurence Spatesachel Morgan Little, MD 07/07/16 2143

## 2016-07-07 NOTE — ED Triage Notes (Signed)
Pt had sudden onset approx hour ago of right lower abd pain. Pt states worsens with palpation. Pt in severe pain. Complains of nausea

## 2016-07-07 NOTE — Discharge Instructions (Signed)
YOUR CT SCAN SHOWED ENLARGED LYMPH NODES IN YOUR CHEST. FOLLOW UP WITH YOUR PRIMARY CARE PROVIDER TO HAVE A CT OF YOUR CHEST DONE IN THE OUTPATIENT CLINIC.

## 2016-07-20 ENCOUNTER — Ambulatory Visit: Payer: Self-pay | Admitting: Surgery

## 2016-07-20 DIAGNOSIS — K439 Ventral hernia without obstruction or gangrene: Secondary | ICD-10-CM | POA: Diagnosis not present

## 2016-07-20 NOTE — H&P (Signed)
William Dodson 07/20/2016 1:55 PM Location: [facility] Patient #: [id number] DOB: 09-Mar-1951 Undefined / Language: [place] / Race: White Male  History of Present Illness (William Lundeen A. 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 He is known about this for about a year, about a year ago while he is working in [place] he had a whooping cough and once that had resolved he noticed a lump in his abdomen just above his umbilicus. It did not bother him again until about 2 weeks ago, when suddenly he developed a lump above and to the right of the umbilicus which was quite painful. This prompted him to go to the emergency department where the hernia was successfully reduced. He has not been symptomatically since then. No nausea or vomiting, no change in bowel habits. No prior abdominal surgery. 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 He is interested in surgical intervention to prevent future episodes of incarceration.  The patient is a 66 year old male.   Diagnostic Studies History William Dodson, CMA; 07/20/2016 1:55 PM) Colonoscopy within last year  Allergies William Dodson, CMA; 07/20/2016 1:58 PM) MORPHINE DOXYCYCLINE Itching. HYDROCODONE Itching. Septra *ANTI-INFECTIVE AGENTS - MISC.* Itching, Rash. Bactrim  Medication History William Dodson, CMA; 07/20/2016 2:02 PM) Arginine (1000MG  Tablet, Oral) Active. Vitamin C (1000MG  Tablet, Oral) Active. Tessalon Perles (100MG  Capsule, Oral) Active. Vitamin D (Cholecalciferol) (1000UNIT Capsule, Oral) Active. Chromium Picolate ( Tablet, Oral) Active. Garlic Oil (1MG  Capsule, Oral) Active. Kelp (100MG  Tablet, Oral) Active. Magnesium (100MG  Tablet, Oral) Active. Vitamin A (10000UNIT Capsule, Oral) Active. Milk Thistle (1000MG  Capsule, Oral) Active. Ubiquinol (100MG  Capsule,  Oral) Active. Menaquinone-7 ( Capsule, Oral) Active. Medications Reconciled  Social History William Dodson, CMA; 07/20/2016 1:55 PM) Alcohol use Moderate alcohol use. Caffeine use Coffee. No drug use Tobacco use Never smoker.  Family History William Dodson, CMA; 07/20/2016 1:55 PM) Breast Cancer Mother. Depression Mother. Diabetes Mellitus Mother. Heart Disease Father. Heart disease in male family member before age 62 Heart disease in male family member before age 18 Seizure disorder Brother.  Other Problems William Dodson, CMA; 07/20/2016 1:55 PM) Sleep Apnea     Review of Systems (William Dodson CMA; 07/20/2016 1:55 PM) General Not Present- Appetite Loss, Chills, Fatigue, Fever, Night Sweats, Weight Gain and Weight Loss. Skin Not Present- Change in Wart/Mole, Dryness, Hives, Jaundice, New Lesions, Non-Healing Wounds, Rash and Ulcer. HEENT Not Present- Earache, Hearing Loss, Hoarseness, Nose Bleed, Oral Ulcers, Ringing in the Ears, Seasonal Allergies, Sinus Pain, Sore Throat, Visual Disturbances, Wears glasses/contact lenses and Yellow Eyes. Respiratory Not Present- Bloody sputum, Chronic Cough, Difficulty Breathing, Snoring and Wheezing. Breast Not Present- Breast Mass, Breast Pain, Nipple Discharge and Skin Changes. Cardiovascular Not Present- Chest Pain, Difficulty Breathing Lying Down, Leg Cramps, Palpitations, Rapid Heart Rate, Shortness of Breath and Swelling of Extremities. Gastrointestinal Present- Hemorrhoids. Not Present- Abdominal Pain, Bloating, Bloody Stool, Change in Bowel Habits, Chronic diarrhea, Constipation, Difficulty Swallowing, Excessive gas, Gets full quickly at meals, Indigestion, Nausea, Rectal Pain and Vomiting. Male Genitourinary Not Present- Blood in Urine, Change in Urinary Stream, Frequency, Impotence, Nocturia, Painful Urination, Urgency and Urine Leakage. Musculoskeletal Not Present- Back Pain, Joint Pain, Joint Stiffness, Muscle Pain, Muscle  Weakness and Swelling of Extremities. Neurological Not Present- Decreased Memory, Fainting, Headaches, Numbness, Seizures, Tingling, Tremor, Trouble walking and Weakness. Psychiatric Not Present- Anxiety, Bipolar, Change in Sleep Pattern, Depression, Fearful and  Frequent crying. Endocrine Not Present- Cold Intolerance, Excessive Hunger, Hair Changes, Heat Intolerance, Hot flashes and New Diabetes. Hematology Not Present- Blood Thinners, Easy Bruising, Excessive bleeding, Gland problems, HIV and Persistent Infections.  Vitals (William Dodson CMA; 07/20/2016 1:56 PM) 07/20/2016 1:56 PM Weight: 229 lb Height: 64in Body Surface Area: 2.07 m Body Mass Index: 39.31 kg/m  Temp.: 97.24F  Pulse: 79 (Regular)  BP: 122/78 (Sitting, Left Arm, Standard)      Physical Exam (William Dodson A. Fredricka Bonineonnor MD; 07/20/2016 2:44 PM)  General Note: He is alert and oriented, no distress  Integumentary Note: No lesions or rashes on limited skin exam  Head and Neck Note: No mass or thyromegaly  Eye Note: extraocular motion intact, anicteric  Chest and Lung Exam Note: unlabored respirations, symmetrical air entry  Cardiovascular Note: regular, no pedal edema  Abdomen Note: soft, nontender, nondistended, no mass or organomegaly. Umbilical and supraumbilical hernias, reduced. Upper midline diastasis  Neurologic Note: grossly intact, normal gait  Neuropsychiatric Note: normal mood and affect, appropriate insight  Musculoskeletal Note: strength symmetrical throughout, no deformity    Assessment & Plan (William Dodson A. William Hottenstein MD; 07/20/2016 2:45 PM)  VENTRAL HERNIA (K43.9) Story: He has both umbilical and supraumbilical hernias both small and currently reduced. We discussed laparoscopic repair with mesh. I discussed all the details of the operation with him, the risks involved and the expected postoperative recovery. He would like to have surgery as soon as possible, [place] followed by [place] after that for his work. He asked appropriate questions all which were answered.

## 2016-08-20 NOTE — Pre-Procedure Instructions (Signed)
William Dodson  08/20/2016      [id number] - [place], [place] - [address] AT [facility] & [address] [place] [address] Phone: [phone] Fax: [phone]    Your procedure is scheduled on Fri, April 27 @ 7:30 AM  Report to [facility] Admitting at 5:30 AM  Call this number if you have problems the morning of surgery:  [phone]   Remember:  Do not eat food or drink liquids after midnight.              Stop taking all Vitamins and Herbal Medications. No Goody's,BC's,Aleve,Advil,Motrin,Ibuprofen,Fish Oil,or Aspirin.    Do not wear jewelry.  Do not wear lotions, powders,colognes, or deoderant.  Men may shave face and neck.  Do not bring valuables to the hospital.  [facility] is not responsible for any belongings or valuables.  Contacts, dentures or bridgework may not be worn into surgery.  Leave your suitcase in the car.  After surgery it may be brought to your room.  For patients admitted to the hospital, discharge time will be determined by your treatment team.  Patients discharged the day of surgery will not be allowed to drive home.    Special [facility] - Preparing for Surgery  Before surgery, you can play an important role.  Because skin is not sterile, your skin needs to be as free of germs as possible.  You can reduce the number of germs on you skin by washing with CHG (chlorahexidine gluconate) soap before surgery.  CHG is an antiseptic cleaner which kills germs and bonds with the skin to continue killing germs even after washing.  Please DO NOT use if you have an allergy to CHG or antibacterial soaps.  If your skin becomes reddened/irritated stop using the CHG and inform your nurse when you arrive at Short Stay.  Do not shave (including legs and underarms) for at least 48 hours prior to the first CHG shower.  You may shave your face.  Please follow these instructions  carefully:   1.  Shower with CHG Soap the night before surgery and the                                morning of Surgery.  2.  If you choose to wash your hair, wash your hair first as usual with your       normal shampoo.  3.  After you shampoo, rinse your hair and body thoroughly to remove the                      Shampoo.  4.  Use CHG as you would any other liquid soap.  You can apply chg directly       to the skin and wash gently with scrungie or a clean washcloth.  5.  Apply the CHG Soap to your body ONLY FROM THE NECK DOWN.        Do not use on open wounds or open sores.  Avoid contact with your eyes,       ears, mouth and genitals (private parts).  Wash genitals (private parts)       with your normal soap.  6.  Wash thoroughly, paying special attention to the area where your surgery        will be performed.  7.  Thoroughly rinse your body with warm water from the neck down.  8.  DO NOT shower/wash with your normal soap after using and rinsing off       the CHG Soap.  9.  Pat yourself dry with a clean towel.            10.  Wear clean pajamas.            11.  Place clean sheets on your bed the night of your first shower and do not        sleep with pets.  Day of Surgery  Do not apply any lotions/deoderants the morning of surgery.  Please wear clean clothes to the hospital/surgery center.    Please read over the following fact sheets that you were given. Pain Booklet, Coughing and Deep Breathing and Surgical Site Infection Prevention

## 2016-08-21 ENCOUNTER — Ambulatory Visit (HOSPITAL_COMMUNITY)
Admission: RE | Admit: 2016-08-21 | Discharge: 2016-08-21 | Disposition: A | Discharge status: Home / Self Care | Payer: BLUE CROSS/BLUE SHIELD | Source: Ambulatory Visit | Attending: Surgery | Admitting: Surgery

## 2016-08-21 ENCOUNTER — Encounter (HOSPITAL_COMMUNITY)
Admission: RE | Admit: 2016-08-21 | Discharge: 2016-08-21 | Disposition: A | Discharge status: Home / Self Care | Payer: BLUE CROSS/BLUE SHIELD | Source: Ambulatory Visit | Attending: Surgery | Admitting: Surgery

## 2016-08-21 DIAGNOSIS — I481 Persistent atrial fibrillation: Secondary | ICD-10-CM | POA: Diagnosis not present

## 2016-08-21 DIAGNOSIS — I514 Myocarditis, unspecified: Secondary | ICD-10-CM | POA: Diagnosis not present

## 2016-08-21 DIAGNOSIS — J40 Bronchitis, not specified as acute or chronic: Secondary | ICD-10-CM | POA: Diagnosis not present

## 2016-08-21 DIAGNOSIS — Z01810 Encounter for preprocedural cardiovascular examination: Secondary | ICD-10-CM | POA: Insufficient documentation

## 2016-08-21 DIAGNOSIS — Z01812 Encounter for preprocedural laboratory examination: Secondary | ICD-10-CM | POA: Insufficient documentation

## 2016-08-21 DIAGNOSIS — M791 Myalgia: Secondary | ICD-10-CM | POA: Diagnosis not present

## 2016-08-21 DIAGNOSIS — Z79899 Other long term (current) drug therapy: Secondary | ICD-10-CM | POA: Diagnosis not present

## 2016-08-21 DIAGNOSIS — K429 Umbilical hernia without obstruction or gangrene: Secondary | ICD-10-CM | POA: Insufficient documentation

## 2016-08-21 LAB — BASIC METABOLIC PANEL
Anion gap: 11 (ref 5–15)
BUN: 15 mg/dL (ref 6–20)
CALCIUM: 9.2 mg/dL (ref 8.9–10.3)
CHLORIDE: 105 mmol/L (ref 101–111)
CO2: 23 mmol/L (ref 22–32)
CREATININE: 1.03 mg/dL (ref 0.61–1.24)
GFR calc Af Amer: >60 mL/min (ref >60)
GFR calc non Af Amer: >60 mL/min (ref >60)
Glucose, Bld: 122 mg/dL — ABNORMAL HIGH (ref 65–99)
Potassium: 4.2 mmol/L (ref 3.5–5.1)
Sodium: 139 mmol/L (ref 135–145)

## 2016-08-21 LAB — CBC
HEMATOCRIT: 45.8 % (ref 39.0–52.0)
HEMOGLOBIN: 15.9 g/dL (ref 13.0–17.0)
MCH: 31.4 pg (ref 26.0–34.0)
MCHC: 34.7 g/dL (ref 30.0–36.0)
MCV: 90.3 fL (ref 78.0–100.0)
Platelets: 227 10*3/uL (ref 150–400)
RBC: 5.07 MIL/uL (ref 4.22–5.81)
RDW: 13.7 % (ref 11.5–15.5)
WBC: 5.6 10*3/uL (ref 4.0–10.5)

## 2016-08-22 NOTE — Progress Notes (Signed)
Anesthesia chart review: Patient is a 66 year old male scheduled for laparoscopic repair of umbilical and supraumbilical hernia with mesh on 08/24/2016 by Dr. Fredricka Bonine. He was seen in the ED on 07/07/16 with abdominal pain and found to have ventral hernia and umbilical hernia s/p successful reduction. He was referred to general surgery for repair.   History includes never smoker, persistent atrial fibrillation s/p ablation '99 [facility], Dr. 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  BMI is consistent with morbid obesity.  PCP is listed as Dr. Morene Crocker. Last primary care visits seen have been through Primary Care at [facility].  Cardiologist is Dr. Loman Brooklyn, last visit 08/11/15. Since his last echo was in 2012, this was repeated (see below). Patient was not symptomatic of his afib, so did not feel that he would need to attempt to convert to SR. He wrote, "Due to his low CHADS2VASc score of 1, have elected not to anticoagulate. 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 He would like to follow-up in 6 months to further discuss management options. I have discussed with him the risk of stroke, which is 0.6% per year." He had previously seen Dr. Eden Emms (primary cardiology) and Dr. Johney Frame (EP cardiology), but not in > 3 years.  Meds include biotin, choline, chromium, inositol, krill oil, magnesium, milk thistle, saw palmetto, niacin, PABA, pantothenic acid, vitamin B-1, B2, B-6, B-12, ubiquinol, folate, vitamin A, vitamin C.  BP (!) 128/92   Pulse 79   Temp 36.9 C   Resp 20   Ht  (1.6 m)   Wt 230 lb 3.2 oz (104.4 kg)   SpO2 97%   BMI 40.78 kg/m   EKG 08/21/16: Afib at 98 bpm.  Echo 08/23/15: Study Conclusions: - Left ventricle: The cavity size was normal. Wall thickness was   increased in a pattern of mild LVH. Systolic function was normal.   The estimated ejection fraction was in the range of 60% to 65%.  Indeterminant diastolic function (atrial fibrillation). Wall   motion was normal; there were no regional wall motion   abnormalities. - Aortic valve: There was no stenosis. There was trivial   regurgitation. - Aorta: Borderline dilated aortic root. Aortic root dimension: 37   mm (ED). - Mitral valve: There was trivial regurgitation. - Left atrium: The atrium was severely dilated. - Right ventricle: The cavity size was normal. Systolic function   was normal. - Right atrium: The atrium was mildly dilated. - Tricuspid valve: Peak RV-RA gradient (S): 24 mm Hg. - Pulmonary arteries: PA peak pressure: 27 mm Hg (S). - Inferior vena cava: The vessel was normal in size. The   respirophasic diameter changes were in the normal range (>= 50%),   consistent with normal central venous pressure. Impressions: - The patient was in atrial fibrillation. Normal LV size with mild   LV hypertrophy. EF 60-65%. Normal RV size and systolic function.   Severe left atrial enlargement.  12 [facility] cardiology notes (scanned under Media tab) describe a cardiac cath in 1993 [facility]) that "revealed no hemodynamically significant coronary artery disease and a thallium study at that time was remarkable for at most mild ischemia.." Testing was done when he presented with afib and mildly elevated troponins in the setting of pneumonia. He was seeing Dr. Viann Fish around this time. He reported his last stress test was in 2008.  CXR 08/21/16: IMPRESSION: Minimal chronic bronchitic  changes without infiltrate.  Preoperative labs noted. Cr 1.03, glucose 122. CBC WNL.   Patient with known afib history since ~ 1993. As of 2017, there were no plans to try to cardiovert. With his low CHADS2VASc score, no plans to anticoagulate. If afib remains rate controlled and no new CV/CHF symptoms then would anticipate that he could proceed as planned.  Reviewed with anesthesiologist Dr. Noreene Larsson.  Velna Ochs [facility] Short Stay  Center/Anesthesiology Phone [phone] 08/22/2016 4:46 PM

## 2016-08-24 ENCOUNTER — Encounter (HOSPITAL_COMMUNITY)
Admission: RE | Disposition: A | Discharge status: Home / Self Care | Payer: Self-pay | Source: Ambulatory Visit | Attending: Surgery

## 2016-08-24 ENCOUNTER — Ambulatory Visit (HOSPITAL_COMMUNITY): Payer: BLUE CROSS/BLUE SHIELD | Admitting: Anesthesiology

## 2016-08-24 ENCOUNTER — Ambulatory Visit (HOSPITAL_COMMUNITY): Payer: BLUE CROSS/BLUE SHIELD | Admitting: Vascular Surgery

## 2016-08-24 ENCOUNTER — Encounter (HOSPITAL_COMMUNITY): Payer: Self-pay | Admitting: *Deleted

## 2016-08-24 ENCOUNTER — Ambulatory Visit (HOSPITAL_COMMUNITY)
Admission: RE | Admit: 2016-08-24 | Discharge: 2016-08-24 | Disposition: A | Discharge status: Home / Self Care | Payer: BLUE CROSS/BLUE SHIELD | Source: Ambulatory Visit | Attending: Surgery | Admitting: Surgery

## 2016-08-24 DIAGNOSIS — Z881 Allergy status to other antibiotic agents status: Secondary | ICD-10-CM | POA: Insufficient documentation

## 2016-08-24 DIAGNOSIS — K420 Umbilical hernia with obstruction, without gangrene: Secondary | ICD-10-CM | POA: Diagnosis not present

## 2016-08-24 DIAGNOSIS — Z803 Family history of malignant neoplasm of breast: Secondary | ICD-10-CM | POA: Diagnosis not present

## 2016-08-24 DIAGNOSIS — Z79899 Other long term (current) drug therapy: Secondary | ICD-10-CM | POA: Insufficient documentation

## 2016-08-24 DIAGNOSIS — Z833 Family history of diabetes mellitus: Secondary | ICD-10-CM | POA: Diagnosis not present

## 2016-08-24 DIAGNOSIS — E785 Hyperlipidemia, unspecified: Secondary | ICD-10-CM | POA: Diagnosis not present

## 2016-08-24 DIAGNOSIS — Z820 Family history of epilepsy and other diseases of the nervous system: Secondary | ICD-10-CM | POA: Insufficient documentation

## 2016-08-24 DIAGNOSIS — K436 Other and unspecified ventral hernia with obstruction, without gangrene: Secondary | ICD-10-CM | POA: Diagnosis not present

## 2016-08-24 DIAGNOSIS — I4891 Unspecified atrial fibrillation: Secondary | ICD-10-CM | POA: Insufficient documentation

## 2016-08-24 DIAGNOSIS — Z885 Allergy status to narcotic agent status: Secondary | ICD-10-CM | POA: Diagnosis not present

## 2016-08-24 DIAGNOSIS — Z818 Family history of other mental and behavioral disorders: Secondary | ICD-10-CM | POA: Insufficient documentation

## 2016-08-24 DIAGNOSIS — Z6841 Body Mass Index (BMI) 40.0 and over, adult: Secondary | ICD-10-CM | POA: Insufficient documentation

## 2016-08-24 DIAGNOSIS — G4733 Obstructive sleep apnea (adult) (pediatric): Secondary | ICD-10-CM | POA: Diagnosis not present

## 2016-08-24 DIAGNOSIS — Z8249 Family history of ischemic heart disease and other diseases of the circulatory system: Secondary | ICD-10-CM | POA: Diagnosis not present

## 2016-08-24 DIAGNOSIS — G4730 Sleep apnea, unspecified: Secondary | ICD-10-CM | POA: Diagnosis not present

## 2016-08-24 DIAGNOSIS — K429 Umbilical hernia without obstruction or gangrene: Secondary | ICD-10-CM | POA: Diagnosis not present

## 2016-08-24 DIAGNOSIS — Z888 Allergy status to other drugs, medicaments and biological substances status: Secondary | ICD-10-CM | POA: Insufficient documentation

## 2016-08-24 DIAGNOSIS — K430 Incisional hernia with obstruction, without gangrene: Secondary | ICD-10-CM | POA: Diagnosis not present

## 2016-08-24 HISTORY — PX: VENTRAL HERNIA REPAIR: SHX424

## 2016-08-24 SURGERY — REPAIR, HERNIA, VENTRAL, LAPAROSCOPIC
Anesthesia: General | Site: Abdomen

## 2016-08-24 MED ORDER — NEOSTIGMINE METHYLSULFATE 5 MG/5ML IV SOSY
PREFILLED_SYRINGE | INTRAVENOUS | Status: AC
  Filled 2016-08-24: qty 5

## 2016-08-24 MED ORDER — FENTANYL CITRATE (PF) 100 MCG/2ML IJ SOLN
INTRAMUSCULAR | Status: DC | PRN
  Administered 2016-08-24: 100 ug via INTRAVENOUS
  Administered 2016-08-24: 50 ug via INTRAVENOUS
  Administered 2016-08-24: 100 ug via INTRAVENOUS

## 2016-08-24 MED ORDER — PROPOFOL 10 MG/ML IV BOLUS
INTRAVENOUS | Status: AC
  Filled 2016-08-24: qty 40

## 2016-08-24 MED ORDER — METOPROLOL TARTRATE 5 MG/5ML IV SOLN
INTRAVENOUS | Status: DC | PRN
  Administered 2016-08-24: 2 mg via INTRAVENOUS
  Administered 2016-08-24 (×2): 1 mg via INTRAVENOUS

## 2016-08-24 MED ORDER — OXYCODONE HCL 5 MG/5ML PO SOLN: 5.0000 mg | Freq: Once | ORAL | Status: AC | PRN

## 2016-08-24 MED ORDER — ONDANSETRON HCL 4 MG/2ML IJ SOLN
INTRAMUSCULAR | Status: AC
  Filled 2016-08-24: qty 2

## 2016-08-24 MED ORDER — ROCURONIUM BROMIDE 10 MG/ML (PF) SYRINGE
PREFILLED_SYRINGE | INTRAVENOUS | Status: AC
  Filled 2016-08-24: qty 5

## 2016-08-24 MED ORDER — ACETAMINOPHEN 500 MG PO TABS
ORAL_TABLET | ORAL | Status: AC
  Filled 2016-08-24: qty 2

## 2016-08-24 MED ORDER — SODIUM CHLORIDE 0.9 % IV SOLN
250.0000 mL | INTRAVENOUS | Status: DC | PRN
Start: 2016-08-24 — End: 2016-08-24

## 2016-08-24 MED ORDER — SODIUM CHLORIDE 0.9% FLUSH: 3.0000 mL | INTRAVENOUS | Status: DC | PRN

## 2016-08-24 MED ORDER — ACETAMINOPHEN 500 MG PO TABS
1000.0000 mg | ORAL_TABLET | ORAL | Status: AC
  Administered 2016-08-24: 1000 mg via ORAL

## 2016-08-24 MED ORDER — OXYCODONE HCL 5 MG PO TABS
5.0000 mg | ORAL_TABLET | Freq: Once | ORAL | Status: AC | PRN
  Administered 2016-08-24: 5 mg via ORAL

## 2016-08-24 MED ORDER — LIDOCAINE 2% (20 MG/ML) 5 ML SYRINGE
INTRAMUSCULAR | Status: AC
  Filled 2016-08-24: qty 5

## 2016-08-24 MED ORDER — ACETAMINOPHEN 650 MG RE SUPP: 650.0000 mg | RECTAL | Status: DC | PRN

## 2016-08-24 MED ORDER — CEFAZOLIN SODIUM 1 G IJ SOLR
INTRAMUSCULAR | Status: AC
  Filled 2016-08-24: qty 20

## 2016-08-24 MED ORDER — BUPIVACAINE HCL (PF) 0.25 % IJ SOLN
INTRAMUSCULAR | Status: AC
  Filled 2016-08-24: qty 30

## 2016-08-24 MED ORDER — HYDROMORPHONE HCL 1 MG/ML IJ SOLN
INTRAMUSCULAR | Status: AC
  Administered 2016-08-24: 0.5 mg via INTRAVENOUS
  Filled 2016-08-24: qty 0.5

## 2016-08-24 MED ORDER — MIDAZOLAM HCL 2 MG/2ML IJ SOLN
INTRAMUSCULAR | Status: AC
  Filled 2016-08-24: qty 2

## 2016-08-24 MED ORDER — OXYCODONE HCL 5 MG PO TABS: 5.0000 mg | ORAL_TABLET | ORAL | Status: DC | PRN

## 2016-08-24 MED ORDER — CEFAZOLIN SODIUM-DEXTROSE 2-4 GM/100ML-% IV SOLN
INTRAVENOUS | Status: AC
  Filled 2016-08-24: qty 100

## 2016-08-24 MED ORDER — SODIUM CHLORIDE 0.9% FLUSH: 3.0000 mL | Freq: Two times a day (BID) | INTRAVENOUS | Status: DC

## 2016-08-24 MED ORDER — GABAPENTIN 300 MG PO CAPS
300.0000 mg | ORAL_CAPSULE | ORAL | Status: AC
  Administered 2016-08-24: 300 mg via ORAL

## 2016-08-24 MED ORDER — ACETAMINOPHEN 325 MG PO TABS: 650.0000 mg | ORAL_TABLET | ORAL | Status: DC | PRN

## 2016-08-24 MED ORDER — GLYCOPYRROLATE 0.2 MG/ML IV SOSY
PREFILLED_SYRINGE | INTRAVENOUS | Status: DC | PRN
  Administered 2016-08-24: .7 mg via INTRAVENOUS

## 2016-08-24 MED ORDER — DEXAMETHASONE SODIUM PHOSPHATE 10 MG/ML IJ SOLN
INTRAMUSCULAR | Status: DC | PRN
  Administered 2016-08-24: 10 mg via INTRAVENOUS

## 2016-08-24 MED ORDER — CEFAZOLIN SODIUM-DEXTROSE 2-4 GM/100ML-% IV SOLN
2.0000 g | INTRAVENOUS | Status: AC
  Administered 2016-08-24: 2 g via INTRAVENOUS

## 2016-08-24 MED ORDER — HYDROMORPHONE HCL 1 MG/ML IJ SOLN
0.2500 mg | INTRAMUSCULAR | Status: DC | PRN
  Administered 2016-08-24 (×4): 0.5 mg via INTRAVENOUS

## 2016-08-24 MED ORDER — NEOSTIGMINE METHYLSULFATE 5 MG/5ML IV SOSY
PREFILLED_SYRINGE | INTRAVENOUS | Status: DC | PRN
  Administered 2016-08-24: 4 mg via INTRAVENOUS

## 2016-08-24 MED ORDER — MIDAZOLAM HCL 5 MG/5ML IJ SOLN
INTRAMUSCULAR | Status: DC | PRN
  Administered 2016-08-24: 2 mg via INTRAVENOUS

## 2016-08-24 MED ORDER — FENTANYL CITRATE (PF) 250 MCG/5ML IJ SOLN
INTRAMUSCULAR | Status: AC
  Filled 2016-08-24: qty 5

## 2016-08-24 MED ORDER — 0.9 % SODIUM CHLORIDE (POUR BTL) OPTIME
TOPICAL | Status: DC | PRN
  Administered 2016-08-24: 1000 mL

## 2016-08-24 MED ORDER — METOPROLOL TARTRATE 5 MG/5ML IV SOLN
INTRAVENOUS | Status: AC
  Filled 2016-08-24: qty 5

## 2016-08-24 MED ORDER — LACTATED RINGERS IV SOLN
INTRAVENOUS | Status: DC | PRN
  Administered 2016-08-24 (×2): via INTRAVENOUS

## 2016-08-24 MED ORDER — PROPOFOL 10 MG/ML IV BOLUS
INTRAVENOUS | Status: DC | PRN
  Administered 2016-08-24: 200 mg via INTRAVENOUS

## 2016-08-24 MED ORDER — LIDOCAINE 2% (20 MG/ML) 5 ML SYRINGE
INTRAMUSCULAR | Status: DC | PRN
  Administered 2016-08-24: 60 mg via INTRAVENOUS

## 2016-08-24 MED ORDER — DOCUSATE SODIUM 100 MG PO CAPS: 100.0000 mg | ORAL_CAPSULE | Freq: Two times a day (BID) | ORAL | 0 refills | Status: AC

## 2016-08-24 MED ORDER — DEXAMETHASONE SODIUM PHOSPHATE 10 MG/ML IJ SOLN
INTRAMUSCULAR | Status: AC
  Filled 2016-08-24: qty 1

## 2016-08-24 MED ORDER — HYDROMORPHONE HCL 1 MG/ML IJ SOLN
INTRAMUSCULAR | Status: AC
  Filled 2016-08-24: qty 1

## 2016-08-24 MED ORDER — ESMOLOL HCL 100 MG/10ML IV SOLN
INTRAVENOUS | Status: DC | PRN
  Administered 2016-08-24 (×2): 20 mg via INTRAVENOUS
  Administered 2016-08-24: 10 mg via INTRAVENOUS
  Administered 2016-08-24: 30 mg via INTRAVENOUS

## 2016-08-24 MED ORDER — ONDANSETRON HCL 4 MG/2ML IJ SOLN
INTRAMUSCULAR | Status: DC | PRN
  Administered 2016-08-24: 4 mg via INTRAVENOUS

## 2016-08-24 MED ORDER — CHLORHEXIDINE GLUCONATE 4 % EX LIQD: 60.0000 mL | Freq: Once | CUTANEOUS | Status: DC

## 2016-08-24 MED ORDER — HYDROMORPHONE HCL 1 MG/ML IJ SOLN
INTRAMUSCULAR | Status: AC
  Administered 2016-08-24: 0.5 mg via INTRAVENOUS
  Filled 2016-08-24: qty 1

## 2016-08-24 MED ORDER — ONDANSETRON HCL 4 MG/2ML IJ SOLN: 4.0000 mg | Freq: Four times a day (QID) | INTRAMUSCULAR | Status: DC | PRN

## 2016-08-24 MED ORDER — GABAPENTIN 300 MG PO CAPS
ORAL_CAPSULE | ORAL | Status: AC
  Filled 2016-08-24: qty 1

## 2016-08-24 MED ORDER — ROCURONIUM BROMIDE 10 MG/ML (PF) SYRINGE
PREFILLED_SYRINGE | INTRAVENOUS | Status: DC | PRN
  Administered 2016-08-24: 50 mg via INTRAVENOUS

## 2016-08-24 MED ORDER — PHENYLEPHRINE 40 MCG/ML (10ML) SYRINGE FOR IV PUSH (FOR BLOOD PRESSURE SUPPORT)
PREFILLED_SYRINGE | INTRAVENOUS | Status: AC
  Filled 2016-08-24: qty 10

## 2016-08-24 MED ORDER — OXYCODONE HCL 5 MG PO TABS
ORAL_TABLET | ORAL | Status: AC
  Administered 2016-08-24: 5 mg via ORAL
  Filled 2016-08-24: qty 1

## 2016-08-24 MED ORDER — ESMOLOL HCL 100 MG/10ML IV SOLN
INTRAVENOUS | Status: AC
  Filled 2016-08-24: qty 10

## 2016-08-24 MED ORDER — FENTANYL CITRATE (PF) 100 MCG/2ML IJ SOLN: 25.0000 ug | INTRAMUSCULAR | Status: DC | PRN

## 2016-08-24 MED ORDER — BUPIVACAINE HCL (PF) 0.25 % IJ SOLN
INTRAMUSCULAR | Status: DC | PRN
  Administered 2016-08-24: 30 mL

## 2016-08-24 MED ORDER — HYDROMORPHONE HCL 1 MG/ML IJ SOLN
INTRAMUSCULAR | Status: DC | PRN
  Administered 2016-08-24 (×2): 0.5 mg via INTRAVENOUS

## 2016-08-24 MED ORDER — OXYCODONE-ACETAMINOPHEN 5-325 MG PO TABS: 1.0000 | ORAL_TABLET | Freq: Four times a day (QID) | ORAL | 0 refills | Status: DC | PRN

## 2016-08-24 SURGICAL SUPPLY — 39 items
APPLIER CLIP 5 13 M/L LIGAMAX5 (MISCELLANEOUS)
BENZOIN TINCTURE PRP APPL 2/3 (GAUZE/BANDAGES/DRESSINGS) ×3 IMPLANT
BLADE CLIPPER SURG (BLADE) ×3 IMPLANT
CHLORAPREP W/TINT 26ML (MISCELLANEOUS) ×3 IMPLANT
CLIP APPLIE 5 13 M/L LIGAMAX5 (MISCELLANEOUS) IMPLANT
CLOSURE WOUND 1/2 X4 (GAUZE/BANDAGES/DRESSINGS) ×1
COVER SURGICAL LIGHT HANDLE (MISCELLANEOUS) ×3 IMPLANT
DERMABOND ADVANCED (GAUZE/BANDAGES/DRESSINGS) ×2
DERMABOND ADVANCED .7 DNX12 (GAUZE/BANDAGES/DRESSINGS) ×1 IMPLANT
DEVICE PMI PUNCTURE CLOSURE (MISCELLANEOUS) ×3 IMPLANT
DEVICE SECURE STRAP 25 ABSORB (INSTRUMENTS) ×3 IMPLANT
DEVICE TROCAR PUNCTURE CLOSURE (ENDOMECHANICALS) ×3 IMPLANT
ELECT REM PT RETURN 9FT ADLT (ELECTROSURGICAL) ×3
ELECTRODE REM PT RTRN 9FT ADLT (ELECTROSURGICAL) ×1 IMPLANT
GLOVE BIO SURGEON STRL SZ 6 (GLOVE) ×6 IMPLANT
GLOVE BIOGEL PI IND STRL 6.5 (GLOVE) ×1 IMPLANT
GLOVE BIOGEL PI INDICATOR 6.5 (GLOVE) ×2
GOWN STRL REUS W/ TWL LRG LVL3 (GOWN DISPOSABLE) ×4 IMPLANT
GOWN STRL REUS W/TWL LRG LVL3 (GOWN DISPOSABLE) ×8
KIT BASIN OR (CUSTOM PROCEDURE TRAY) ×3 IMPLANT
KIT ROOM TURNOVER OR (KITS) ×3 IMPLANT
MARKER SKIN DUAL TIP RULER LAB (MISCELLANEOUS) ×3 IMPLANT
MESH VENTRALIGHT ST 4X6IN (Mesh General) ×3 IMPLANT
NEEDLE SPNL 22GX3.5 QUINCKE BK (NEEDLE) ×3 IMPLANT
NS IRRIG 1000ML POUR BTL (IV SOLUTION) ×3 IMPLANT
PAD ARMBOARD 7.5X6 YLW CONV (MISCELLANEOUS) ×6 IMPLANT
SCISSORS LAP 5X35 DISP (ENDOMECHANICALS) ×3 IMPLANT
SHEARS HARMONIC ACE PLUS 36CM (ENDOMECHANICALS) IMPLANT
SLEEVE ENDOPATH XCEL 5M (ENDOMECHANICALS) ×3 IMPLANT
STRIP CLOSURE SKIN 1/2X4 (GAUZE/BANDAGES/DRESSINGS) ×2 IMPLANT
SUT ETHIBOND CT1 BRD #0 30IN (SUTURE) ×6 IMPLANT
SUT MNCRL AB 4-0 PS2 18 (SUTURE) ×3 IMPLANT
SUT NOVA NAB GS-21 0 18 T12 DT (SUTURE) IMPLANT
SUT PDS AB 2-0 CT1 27 (SUTURE) IMPLANT
TOWEL OR 17X24 6PK STRL BLUE (TOWEL DISPOSABLE) ×3 IMPLANT
TRAY LAPAROSCOPIC MC (CUSTOM PROCEDURE TRAY) ×3 IMPLANT
TROCAR XCEL NON-BLD 11X100MML (ENDOMECHANICALS) ×3 IMPLANT
TROCAR XCEL NON-BLD 5MMX100MML (ENDOMECHANICALS) ×3 IMPLANT
TUBING INSUFFLATION (TUBING) ×3 IMPLANT

## 2016-08-24 NOTE — Anesthesia Preprocedure Evaluation (Signed)
Anesthesia Evaluation  Patient identified by MRN, date of birth, ID band Patient awake    Reviewed: Allergy & Precautions, H&P , NPO status , Patient's Chart, lab work & pertinent test results  Airway Mallampati: II   Neck ROM: full    Dental   Pulmonary sleep apnea ,    breath sounds clear to auscultation       Cardiovascular negative cardio ROS  + dysrhythmias Atrial Fibrillation  Rhythm:irregular Rate:Normal     Neuro/Psych    GI/Hepatic   Endo/Other  Morbid obesity  Renal/GU      Musculoskeletal   Abdominal   Peds  Hematology   Anesthesia Other Findings   Reproductive/Obstetrics                             Anesthesia Physical Anesthesia Plan  ASA: III  Anesthesia Plan: General   Post-op Pain Management:    Induction: Intravenous  Airway Management Planned: Oral ETT  Additional Equipment:   Intra-op Plan:   Post-operative Plan: Extubation in OR  Informed Consent: I have reviewed the patients History and Physical, chart, labs and discussed the procedure including the risks, benefits and alternatives for the proposed anesthesia with the patient or authorized representative who has indicated his/her understanding and acceptance.     Plan Discussed with: CRNA, Anesthesiologist and Surgeon  Anesthesia Plan Comments:         Anesthesia Quick Evaluation

## 2016-08-24 NOTE — Transfer of Care (Signed)
Immediate Anesthesia Transfer of Care Note  Patient: William Dodson  Procedure(s) Performed: Procedure(s) with comments: LAPAROSCOPIC VENTRAL HERNIA REPAIR WITH MESH (N/A) - Laparoscopic Repair of Umbilical and Supraumbilical Hernia with Mesh   Patient Location: PACU  Anesthesia Type:General  Level of Consciousness: awake, patient cooperative and responds to stimulation  Airway & Oxygen Therapy: Patient Spontanous Breathing and Patient connected to nasal cannula oxygen  Post-op Assessment: Report given to RN and Post -op Vital signs reviewed and stable  Post vital signs: Reviewed and stable  Last Vitals:  Vitals:   08/24/16 0626  BP: (!) 149/79  Pulse: 90  Resp: 20  Temp: 36.8 C    Last Pain:  Vitals:   08/24/16 0626  TempSrc: Oral         Complications: No apparent anesthesia complications

## 2016-08-24 NOTE — Anesthesia Postprocedure Evaluation (Signed)
Anesthesia Post Note  Patient: William Dodson  Procedure(s) Performed: Procedure(s) (LRB): LAPAROSCOPIC VENTRAL HERNIA REPAIR WITH MESH (N/A)  Patient location during evaluation: PACU Anesthesia Type: General Level of consciousness: awake and alert and patient cooperative Pain management: pain level controlled Vital Signs Assessment: post-procedure vital signs reviewed and stable Respiratory status: spontaneous breathing and respiratory function stable Cardiovascular status: stable Anesthetic complications: no       Last Vitals:  Vitals:   08/24/16 1035 08/24/16 1104  BP:  133/90  Pulse: 87 (!) 104  Resp: 10 16  Temp:  36.8 C    Last Pain:  Vitals:   08/24/16 1030  TempSrc:   PainSc: 9                  Mattew Chriswell S

## 2016-08-24 NOTE — Discharge Instructions (Signed)
LAPAROSCOPIC SURGERY: POST OP INSTRUCTIONS  ######################################################################  EAT Gradually transition to a high fiber diet with a fiber supplement over the next few weeks after discharge.  Start with a pureed / full liquid diet (see below)  WALK Walk an hour a day.  Control your pain to do that.    CONTROL PAIN Control pain so that you can walk, sleep, tolerate sneezing/coughing, go up/down stairs.  HAVE A BOWEL MOVEMENT DAILY Keep your bowels regular to avoid problems.  OK to try a laxative to override constipation.  OK to use an antidairrheal to slow down diarrhea.  Call if not better after 2 tries  CALL IF YOU HAVE PROBLEMS/CONCERNS Call if you are still struggling despite following these instructions. Call if you have concerns not answered by these instructions  ######################################################################    1. DIET: Follow a light bland diet the first 24 hours after arrival home, such as soup, liquids, crackers, etc.  Be sure to include lots of fluids daily.  Avoid fast food or heavy meals as your are more likely to get nauseated.  Eat a low fat the next few days after surgery.   2. Take your usually prescribed home medications unless otherwise directed. 3. PAIN CONTROL: a. Pain is best controlled by a usual combination of three different methods TOGETHER: i. Ice/Heat ii. Over the counter pain medication iii. Prescription pain medication b. Most patients will experience some swelling and bruising around the incisions.  Ice packs or heating pads (30-60 minutes up to 6 times a day) will help. Use ice for the first few days to help decrease swelling and bruising, then switch to heat to help relax tight/sore spots and speed recovery.  Some people prefer to use ice alone, heat alone, alternating between ice & heat.  Experiment to what works for you.  Swelling and bruising can take several weeks to resolve.   c. It is  helpful to take an over-the-counter pain medication regularly for the first few weeks.  Choose one of the following that works best for you: i. Naproxen (Aleve, etc)  Two 251m tabs twice a day ii. Ibuprofen (Advil, etc) Three 2053mtabs four times a day (every meal & bedtime) iii. Acetaminophen (Tylenol, etc) 500-65044mour times a day (every meal & bedtime) d. A  prescription for pain medication (such as oxycodone, hydrocodone, etc) should be given to you upon discharge.  Take your pain medication as prescribed.  i. If you are having problems/concerns with the prescription medicine (does not control pain, nausea, vomiting, rash, itching, etc), please call us [phone] see if we need to switch you to a different pain medicine that will work better for you and/or control your side effect better. ii. If you need a refill on your pain medication, please contact your pharmacy.  They will contact our office to request authorization. Prescriptions will not be filled after 5 pm or on week-ends. 4. Avoid getting constipated.  Between the surgery and the pain medications, it is common to experience some constipation.  Increasing fluid intake and taking a fiber supplement (such as Metamucil, Citrucel, FiberCon, MiraLax, etc) 1-2 times a day regularly will usually help prevent this problem from occurring.  A mild laxative (prune juice, Milk of Magnesia, MiraLax, etc) should be taken according to package directions if there are no bowel movements after 48 hours.   5. Watch out for diarrhea.  If you have many loose bowel movements, simplify your diet to bland foods & liquids for  a few days.  Stop any stool softeners and decrease your fiber supplement.  Switching to mild anti-diarrheal medications (Kayopectate, Pepto Bismol) can help.  If this worsens or does not improve, please call us. 6. Wash / shower every day. Starting 48 hours after surgery.  No scrubbing, no lotions or ointments to incisions. Let soap and  water run over incisions and pat dry. The skin glue will flake off after a few weeks. The steri strips will peel off after about 2 weeks, ok to remove them if they haven't fallen off by then.    7. ACTIVITIES as tolerated:   a. You may resume regular (light) daily activities beginning the next day--such as daily self-care, walking, climbing stairs--gradually increasing activities as tolerated.  If you can walk 30 minutes without difficulty, it is safe to try more intense activity such as jogging, treadmill, bicycling, low-impact aerobics, swimming, etc. b. Save the most intensive and strenuous activity for last such as sit-ups, heavy lifting, contact sports, etc  Refrain from any heavy lifting or straining for about 4 weeks.   c. DO NOT PUSH THROUGH PAIN.  Let pain be your guide: If it hurts to do something, don't do it.  Pain is your body warning you to avoid that activity for another week until the pain goes down. d. You may drive when you are no longer taking prescription pain medication, you can comfortably wear a seatbelt, and you can safely maneuver your car and apply brakes. e. Bonita Quin may have sexual intercourse when it is comfortable.  8. FOLLOW UP in our office a. Please call [facility] at [phone] to set up an appointment to see your surgeon in the office for a follow-up appointment approximately 2-3 weeks after your surgery. b. Make sure that you call for this appointment the day you arrive home to insure a convenient appointment time. 10. IF YOU HAVE DISABILITY OR FAMILY LEAVE FORMS, BRING THEM TO THE OFFICE FOR PROCESSING.  DO NOT GIVE THEM TO YOUR DOCTOR.   WHEN TO CALL us [phone]: 1. Poor pain control 2. Reactions / problems with new medications (rash/itching, nausea, etc)  3. Fever over 101.5 F (38.5 C) 4. Inability to urinate 5. Nausea and/or vomiting 6. Worsening swelling or bruising 7. Continued bleeding from incision. 8. Increased pain, redness, or drainage from the  incision   The clinic staff is available to answer your questions during regular business hours (8:30am-5pm).  Please dont hesitate to call and ask to speak to one of our nurses for clinical concerns.   If you have a medical emergency, go to the nearest emergency room or call 911.  A surgeon from [facility] is always on call at the [facility], [place] [address], [address], [place], [place]  [address] ? MAIN: [phone] ? TOLL FREE: [phone] ?  FAX [phone] [email or web address]

## 2016-08-24 NOTE — H&P (Signed)
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 He is known about this for about a year, about a year ago while he is working in [place] he had a whooping cough and once that had resolved he noticed a lump in his abdomen just above his umbilicus. It did not bother him again until about 2 weeks ago, when suddenly he developed a lump above and to the right of the umbilicus which was quite painful. This prompted him to go to the emergency department where the hernia was successfully reduced. He has not been symptomatically since then. No nausea or vomiting, no change in bowel habits. No prior abdominal surgery. 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 He is interested in surgical intervention to prevent future episodes of incarceration.   Diagnostic Studies History  Colonoscopy within last year  Allergies  MORPHINE DOXYCYCLINE Itching. HYDROCODONE Itching. Septra *ANTI-INFECTIVE AGENTS - MISC.* Itching, Rash. Bactrim  Medication History  Arginine (  Tablet, Oral) Active. Vitamin C (  Tablet, Oral) Active. Tessalon Perles (  Capsule, Oral) Active. Vitamin D (Cholecalciferol) (1000UNIT Capsule, Oral) Active. Chromium Picolate ( Tablet, Oral) Active. Garlic Oil (  Capsule, Oral) Active. Kelp (  Tablet, Oral) Active. Magnesium (  Tablet, Oral) Active. Vitamin A (10000UNIT Capsule, Oral) Active. Milk Thistle (  Capsule, Oral) Active. Ubiquinol (  Capsule, Oral) Active. Menaquinone-7 ( Capsule, Oral) Active. Medications Reconciled  Social History  Alcohol use Moderate alcohol use. Caffeine use Coffee. No drug use Tobacco use Never smoker.  Family History Breast Cancer  Mother. Depression Mother. Diabetes Mellitus Mother. Heart Disease Father. Heart disease in male family member before age 5 Heart disease in male family member before age 67 Seizure disorder Brother.  Other Problems  Sleep Apnea   Review of Systems (Armen Glenn CMA; 07/20/2016 1:55 PM) General Not Present- Appetite Loss, Chills, Fatigue, Fever, Night Sweats, Weight Gain and Weight Loss. Skin Not Present- Change in Wart/Mole, Dryness, Hives, Jaundice, New Lesions, Non-Healing Wounds, Rash and Ulcer. HEENT Not Present- Earache, Hearing Loss, Hoarseness, Nose Bleed, Oral Ulcers, Ringing in the Ears, Seasonal Allergies, Sinus Pain, Sore Throat, Visual Disturbances, Wears glasses/contact lenses and Yellow Eyes. Respiratory Not Present- Bloody sputum, Chronic Cough, Difficulty Breathing, Snoring and Wheezing. Breast Not Present- Breast Mass, Breast Pain, Nipple Discharge and Skin Changes. Cardiovascular Not Present- Chest Pain, Difficulty Breathing Lying Down, Leg Cramps, Palpitations, Rapid Heart Rate, Shortness of Breath and Swelling of Extremities. Gastrointestinal Present- Hemorrhoids. Not Present- Abdominal Pain, Bloating, Bloody Stool, Change in Bowel Habits, Chronic diarrhea, Constipation, Difficulty Swallowing, Excessive gas, Gets full quickly at meals, Indigestion, Nausea, Rectal Pain and Vomiting. Male Genitourinary Not Present- Blood in Urine, Change in Urinary Stream, Frequency, Impotence, Nocturia, Painful Urination, Urgency and Urine Leakage. Musculoskeletal Not Present- Back Pain, Joint Pain, Joint Stiffness, Muscle Pain, Muscle Weakness and Swelling of Extremities. Neurological Not Present- Decreased Memory, Fainting, Headaches, Numbness, Seizures, Tingling, Tremor, Trouble walking and Weakness. Psychiatric Not Present- Anxiety, Bipolar, Change in Sleep Pattern, Depression, Fearful and Frequent crying. Endocrine Not Present- Cold Intolerance, Excessive Hunger, Hair  Changes, Heat Intolerance, Hot flashes and New Diabetes. Hematology Not Present- Blood Thinners, Easy Bruising, Excessive bleeding, Gland problems, HIV and Persistent Infections.  Vitals:   08/24/16 0626  BP: (!) 149/79  Pulse: 90  Resp: 20  Temp: 98.2 F (36.8 C)  Physical Exam   General Note: He is alert and oriented, no distress  Integumentary Note: No lesions or rashes on limited skin exam  Head and Neck Note: No mass or thyromegaly  Eye Note: extraocular motion intact, anicteric  Chest and Lung Exam Note: unlabored respirations, symmetrical air entry  Cardiovascular Note: palpable distal pulses, no pedal edema  Abdomen Note: soft, nontender, nondistended, no mass or organomegaly. Umbilical and supraumbilical hernias, reduced. Upper midline diastasis  Neurologic Note: grossly intact, normal gait  Neuropsychiatric Note: normal mood and affect, appropriate insight  Musculoskeletal Note: strength symmetrical throughout, no deformity    Assessment & Plan   VENTRAL HERNIA (K43.9) Story: He has both umbilical and supraumbilical hernias both small and currently reduced. We discussed laparoscopic repair with mesh. I discussed all the details of the operation with him, the risks involved and the expected postoperative recovery. He would like to have surgery as soon as possible, he is planning a trip to [place] in mid-May followed by retrograde really after that for his work. He asked appropriate questions all which were answered

## 2016-08-24 NOTE — Op Note (Signed)
Operative Note  William Dodson  [id number]  [id number]  08/24/2016   Surgeon: Berna Bue  Assistant: OR staff  Procedure performed: laparoscopic repair of umbilical and epigastric hernia with ventralite mesh  Preop diagnosis: umbilical and epigastric hernia, chronically incarcerated Post-op diagnosis/intraop findings: same  Specimens: none Retained items: none EBL: 30cc Complications: none  Description of procedure: After obtaining informed consent the patient was taken to the operating room and placed supine on operating room table wheregeneral endotracheal anesthesia was initiated, preoperative antibiotics were administered, SCDs applied, and a formal timeout was performed. The abdomen was clipped, prepped and draped in the usual sterile fashion. The peritoneal cavity was entered using a Visiport technique in the left upper quadrant and insufflated to 15 mmHg without incident. Gross inspection revealed no abnormalities or evidence of injury from our entry. A 12 mm trocar and a 5 mm trocar were placed under direct visualization in the left hemiabdomen. The epigastric hernia was visualized just to the right of midline, this was very small, less than 1 cm in diameter. The hernia sac was reduced and dissected away from the abdominal wall using blunt dissection and cautery. A small amount of incarcerated fat was reduced and hemostasis was ensured in the area using cautery. The incarcerated fat and sac were in continuity with the falciform ligament, which was dissected off of the abdominal wall using cautery for a distance of several centimeters to allow landing zone for the mesh. The previously incarcerated fat, hernia sac, and a small segment of the vas on ligament were excised and removed from the peritoneal cavity, again ensuring hemostasis with cautery. Then turned to the umbilical hernia which was also proximally 1 cm in diameter and about 2 cm inferior to the epigastric hernia.  The hernia sac was reduced and dissected away using cautery and blunt dissection and a small amount of incarcerated fat was reduced taking care to avoid injury to the overlying skin. A 4" x 6" ventral light mesh was selected and tacking sutures were placed at the 4 corners using 0 Ethibond. The rough side was marked to ensure appropriate orientation. The mesh was then moistened and introduced through the 12 mm trocar and unfurled. Using a lap was Suture passer, small stab wounds were made in the skin and the tacking Ethibond sutures were brought up through the abdominal wall and tied down. The secure the mesh superiorly, inferiorly and bilaterally. It was noted to lie nicely with at least 2 inches of overlap circumferentially around both of the hernias. Then used to secure strap tacker to secure the edges of the mesh circumferentially to the abdominal wall. An inner crown of tacks was also placed. Again the mesh was noted to via nice and flat against the abdominal wall without any tension or exposed rough edges. The abdomen was then once again surveyed and confirmed to be free of any injury or abnormality. The 12 motor trocar site was closed with 0 Vicryl in the fascia using a laparoscopic suture passer under direct visualization. Local was then injected around each of the trocar sites in the skin stab incisions. All trochars were removed and the abdomen was desufflated. The trocar site incisions were closed with subcuticular Monocryl and Dermabond. The stab incisions were closed with Steri-Strips. The patient was then awakened, extubated and taken to PACU in stable condition.   All counts were correct at the completion of the case.

## 2016-08-27 ENCOUNTER — Encounter (HOSPITAL_COMMUNITY): Payer: Self-pay | Admitting: Surgery

## 2017-01-01 ENCOUNTER — Ambulatory Visit (INDEPENDENT_AMBULATORY_CARE_PROVIDER_SITE_OTHER): Payer: Self-pay | Admitting: Physician Assistant

## 2017-01-01 ENCOUNTER — Encounter: Payer: Self-pay | Admitting: Physician Assistant

## 2017-01-01 VITALS — BP 134/88 | HR 91 | Temp 97.8°F | Resp 16 | Ht 64.0 in | Wt 226.8 lb

## 2017-01-01 DIAGNOSIS — Z0289 Encounter for other administrative examinations: Secondary | ICD-10-CM

## 2017-01-01 NOTE — Progress Notes (Signed)
   Commercial Driver Medical Examination   William CopasJames E Mcilvaine is a 66 y.o. male with a pertinent medical history of AFIB status post ablation and well controlled sleep apnea who presents today for a commercial driver fitness determination physical exam. The patient reports no problems today. 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  He denies focal neurological deficits, vision and hearing changes. He denies the habitual use of benzodiazepines, opioids, amphetamines and denies illicit drug use.   Current medications, family history, allergies, social history reviewed by me and exist elsewhere in the encounter.   Review of Systems  Constitutional: Negative for chills, diaphoresis and fever.  Eyes: Negative.   Respiratory: Negative for cough, hemoptysis, sputum production, shortness of breath and wheezing.   Cardiovascular: Negative for chest pain, orthopnea and leg swelling.  Gastrointestinal: Negative for nausea.  Skin: Negative for rash.  Neurological: Negative for dizziness, sensory change, speech change, focal weakness and headaches.    Objective:     Vision/hearing:  Visual Acuity Screening   Right eye Left eye Both eyes  Without correction:     With correction: 20/25 20/25 20/25   Comments: Color vision passed Horizontal Field at 85 L eye, 70 R eye  Hearing Screening Comments: Whisper test, passed at [employer]10 feet  Applicant can recognize and distinguish among traffic control signals and devices showing standard red, green, and amber colors.  Corrective lenses required: Yes  Monocular Vision?: No  Hearing aid requirement: No  Physical Exam  Constitutional: He is oriented to person, place, and time. He appears well-developed. He is active and cooperative.  Non-toxic appearance.  Cardiovascular: Normal rate, regular rhythm, S1 normal, S2 normal, normal heart sounds, intact distal pulses and normal pulses.  Exam reveals no gallop and no friction rub.   No murmur heard. Pulmonary/Chest: Effort  normal. No stridor. No tachypnea. No respiratory distress. He has no wheezes. He has no rales.  Abdominal: He exhibits no distension.  Musculoskeletal: He exhibits no edema.  Neurological: He is alert and oriented to person, place, and time.  Skin: Skin is warm and dry. He is not diaphoretic. No pallor.  Vitals reviewed.   BP 134/88 (BP Location: Right Arm, Patient Position: Sitting, Cuff Size: Large)   Pulse 91   Temp 97.8 F (36.6 C) (Oral)   Resp 16   Ht 5\' 4"  (1.626 m)   Wt 226 lb 12.8 oz (102.9 kg)   SpO2 94%   BMI 38.93 kg/m   Labs:  Urine neg for protein, blood, sugar, normal pec grav.   Assessment:    Healthy male exam.  Meets standards, but periodic monitoring required due to controlled OSA and history of afib.  Driver qualified only for 1 year.    Plan:    Medical examiners certificate completed and printed. Return as needed.    Deliah BostonMichael Sarim Rothman, MS, PA-C 11:59 AM, 01/01/2017

## 2017-03-04 DIAGNOSIS — G4733 Obstructive sleep apnea (adult) (pediatric): Secondary | ICD-10-CM | POA: Diagnosis not present

## 2017-12-22 IMAGING — CR DG CHEST 2V
2 series · 2 of 2 positions shown · non-contrast
Comparison: 07/22/2015

CLINICAL DATA: Preoperative evaluation for hernia repair

EXAM:
CHEST  2 VIEW

[w chest pa]
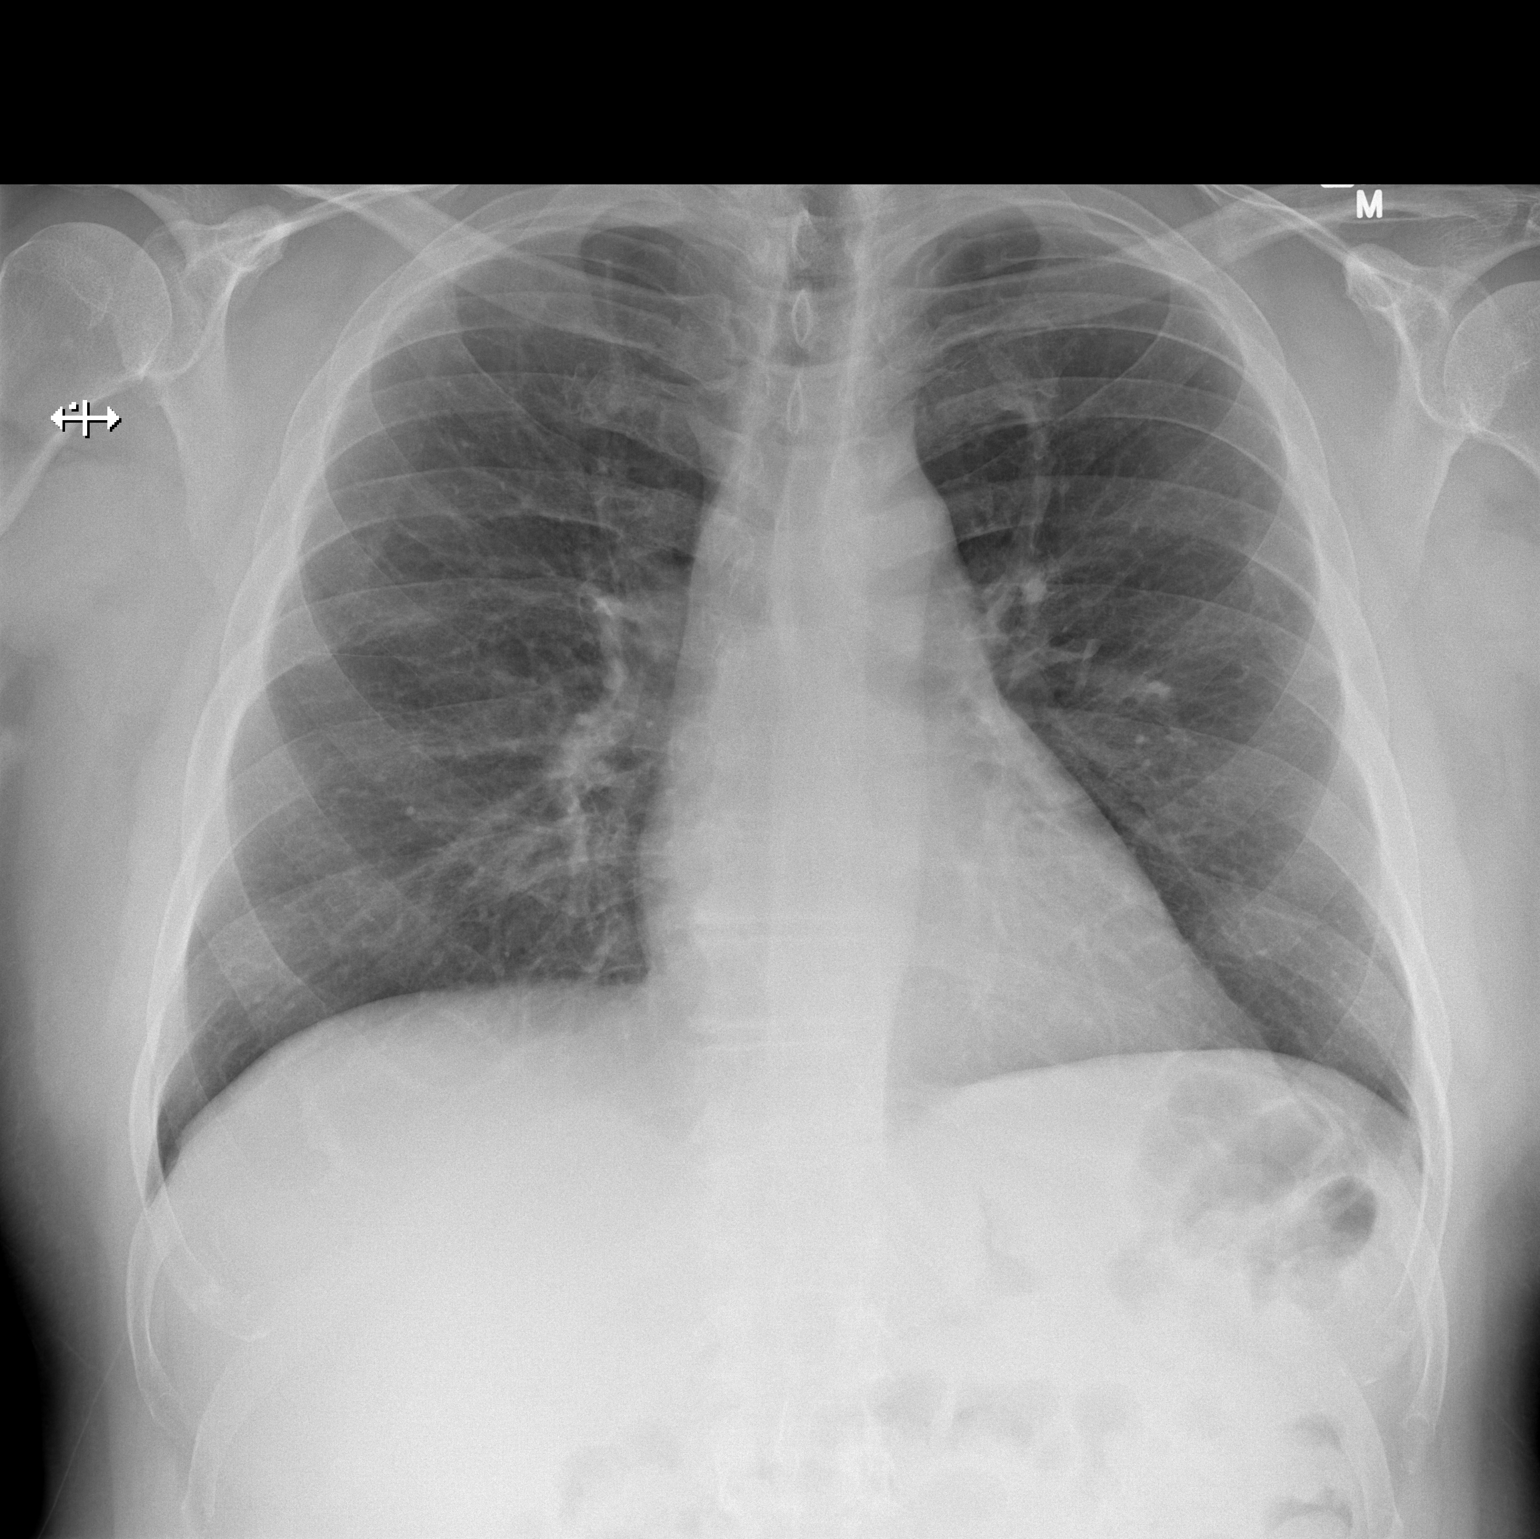

[w chest lat]
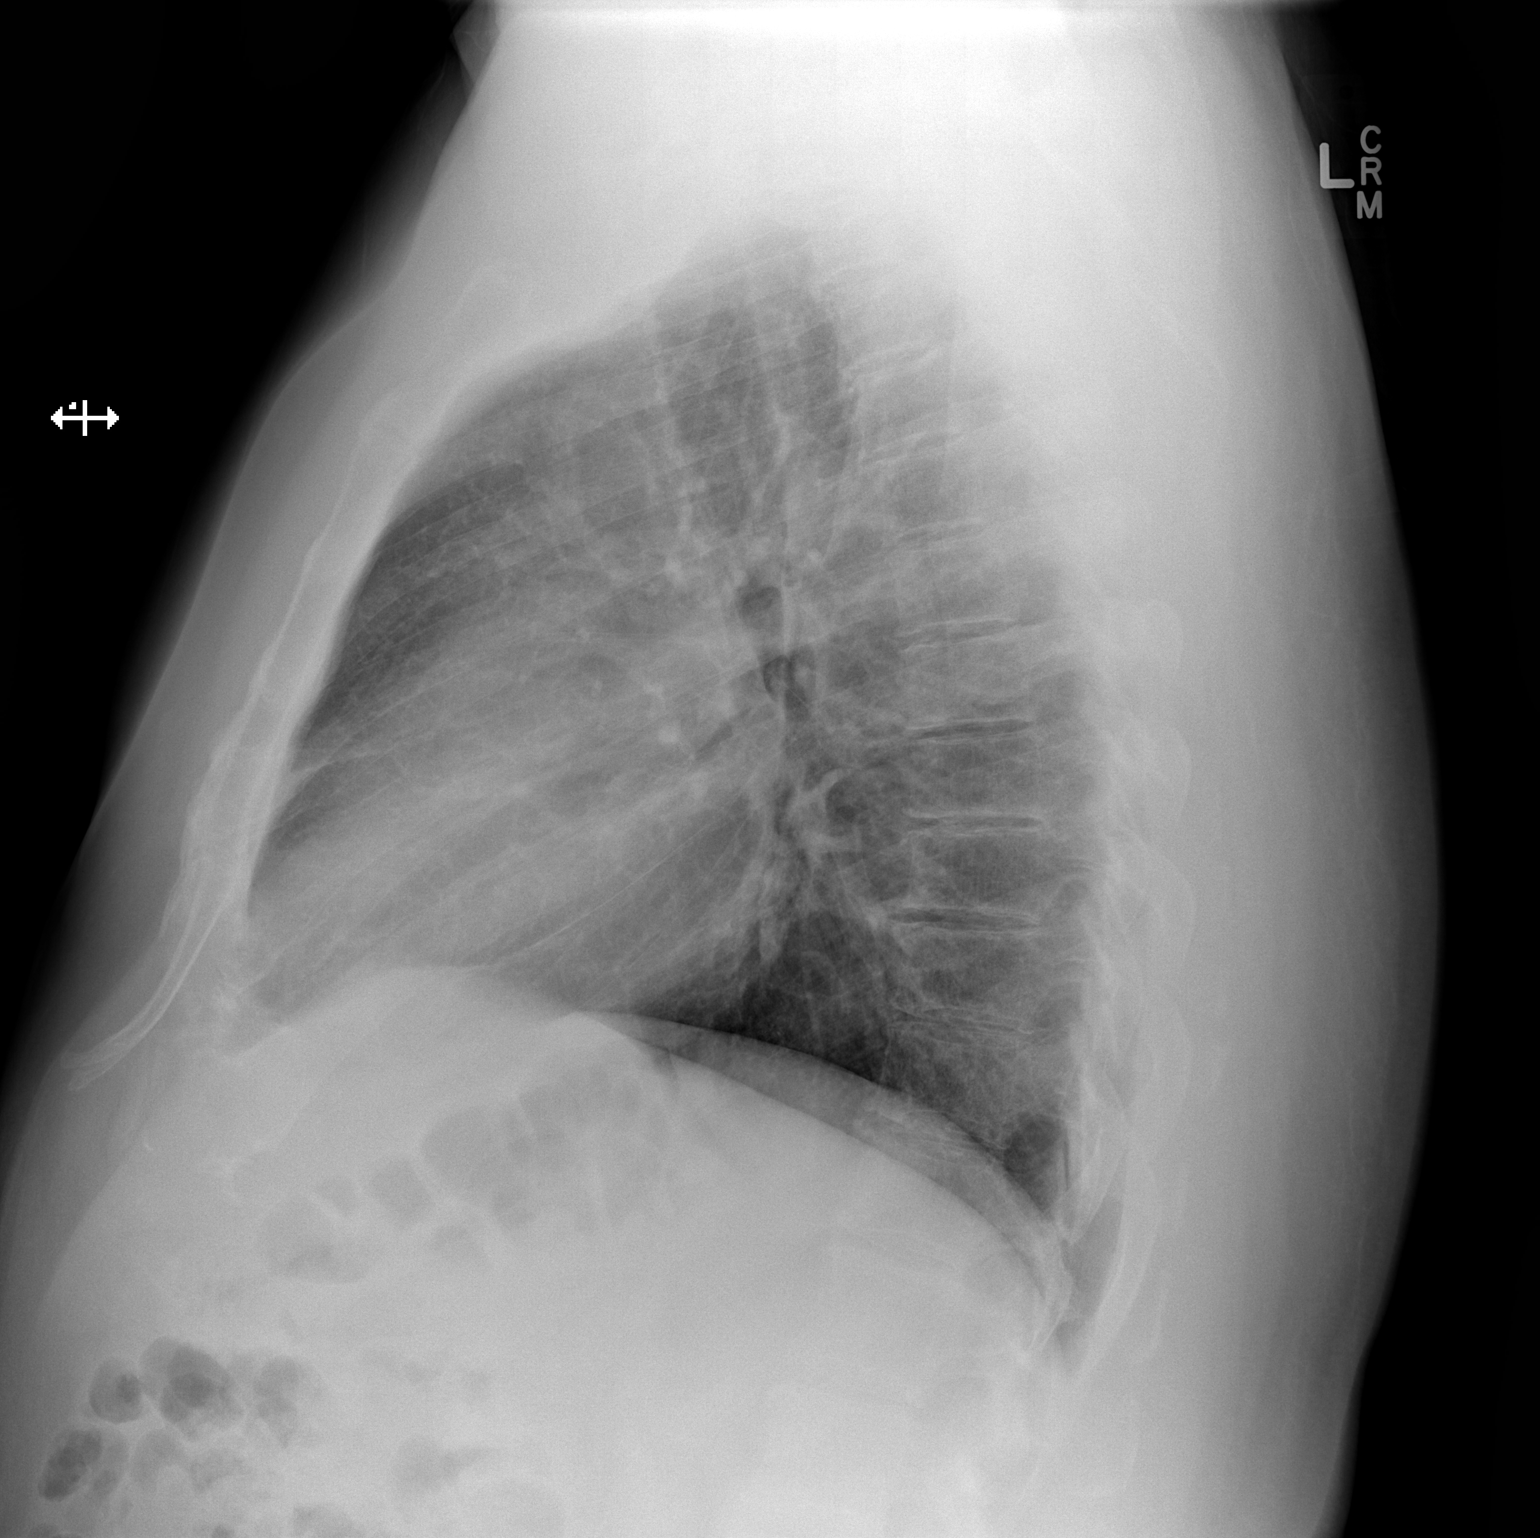

[2 of 2 positions shown; findings below may reference images not displayed]

FINDINGS: Normal heart size, mediastinal contours, and pulmonary vascularity.

Mild chronic bronchitic changes.

Lungs otherwise clear.

No pleural effusion or pneumothorax.

No acute bony abnormalities.
IMPRESSION: Minimal chronic bronchitic changes without infiltrate.

## 2018-01-13 DIAGNOSIS — H2513 Age-related nuclear cataract, bilateral: Secondary | ICD-10-CM | POA: Diagnosis not present

## 2018-01-13 DIAGNOSIS — H04123 Dry eye syndrome of bilateral lacrimal glands: Secondary | ICD-10-CM | POA: Diagnosis not present

## 2018-01-13 DIAGNOSIS — H43393 Other vitreous opacities, bilateral: Secondary | ICD-10-CM | POA: Diagnosis not present

## 2018-04-15 ENCOUNTER — Encounter: Payer: Self-pay | Admitting: Cardiology

## 2018-04-15 ENCOUNTER — Ambulatory Visit: Payer: BLUE CROSS/BLUE SHIELD | Admitting: Cardiology

## 2018-04-15 VITALS — BP 122/72 | HR 88 | Ht 64.0 in | Wt 230.0 lb

## 2018-04-15 DIAGNOSIS — R202 Paresthesia of skin: Secondary | ICD-10-CM

## 2018-04-15 DIAGNOSIS — I4811 Longstanding persistent atrial fibrillation: Secondary | ICD-10-CM | POA: Diagnosis not present

## 2018-04-15 DIAGNOSIS — Z79899 Other long term (current) drug therapy: Secondary | ICD-10-CM | POA: Diagnosis not present

## 2018-04-15 DIAGNOSIS — R200 Anesthesia of skin: Secondary | ICD-10-CM

## 2018-04-15 NOTE — Patient Instructions (Signed)
Medication Instructions:  Your physician recommends that you continue on your current medications as directed. Please refer to the Current Medication list given to you today.  If you need a refill on your cardiac medications before your next appointment, please call your pharmacy.   Lab work: Today: BMET, CBC, A1c  Testing/Procedures: Your physician has requested that you have an echocardiogram. Echocardiography is a painless test that uses sound waves to create images of your heart. It provides your doctor with information about the size and shape of your heart and how well your heart's chambers and valves are working. This procedure takes approximately one hour. There are no restrictions for this procedure.   Follow-Up: [facility], you and your health needs are our priority.  As part of our continuing mission to provide you with exceptional heart care, we have created designated Provider Care Teams.  These Care Teams include your primary Cardiologist (physician) and Advanced Practice Providers (APPs -  Physician Assistants and Nurse Practitioners) who all work together to provide you with the care you need, when you need it. You will need a follow up appointment in 3 months.  Please call our office 2 months in advance to schedule this appointment.  You may see Dr. Elberta Fortisamnitz or one of the following Advanced Practice Providers on your designated Care Team:   Gypsy BalsamAmber Seiler, NP . Francis Dowseenee Ursuy, PA-C  Thank you for choosing [facility]!!   Dory HornSherri Price, RN [phone]  Any Other Special Instructions Will Be Listed Below (If Applicable).

## 2018-04-15 NOTE — Progress Notes (Signed)
Electrophysiology Office Note   Date:  04/15/2018   ID:  William Dodson, DOB 17-Feb-1951, MRN [id number]  PCP:  Patient, No Pcp Per  Primary Electrophysiologist:  Dr Elberta Fortis    CC: Follow up for atrial fibrillation   History of Present Illness: William Dodson is a 67 y.o. male who presents today for electrophysiology evaluation.    He has a history of sleep apnea, atrial fibrillation status post PVI [facility] in 1997 by Dr. 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 Patient reports that over the last six months he has had more dyspnea on exertion and more fatigue. He notes faster heart rates when he exerts but it slows quickly when he rests. He is able to do most daily activities without issue. He also complains of numbness and tingling in his toes bilaterally. He is compliant with his CPAP therapy. He has not followed routinely with a PCP.  Today, denies symptoms of palpitations, chest pain, orthopnea, PND, lower extremity edema, claudication, dizziness, presyncope, syncope, bleeding, or neurologic sequela. The patient is tolerating medications without difficulties. +SOB, +fatigue    Past Medical History:  Diagnosis Date  . Atrial fibrillation (HCC)    persistent,  s/p PVI by Dr Delena Serve at [facility]  . Myalgia   . 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(HCC)    "heart infection" in setting of pneumonia 1993  . Sleep apnea    recent sleep study, results pending   Past Surgical History:  Procedure Laterality Date  . ATRIAL ABLATION SURGERY  1999   afib ablation 1990s at [facility] by Ronn Melena  . VENTRAL HERNIA REPAIR N/A 08/24/2016   Procedure: LAPAROSCOPIC VENTRAL HERNIA REPAIR WITH MESH;  Surgeon: Berna Bue, MD;  Location: [facility] OR;  Service: General;  Laterality: N/A;  Laparoscopic Repair of Umbilical and Supraumbilical Hernia with Mesh      Current Outpatient Medications  Medication Sig Dispense Refill  . b complex vitamins tablet Take 1 tablet by mouth daily.     Marland Kitchen BIOTIN PO  Take 50 mcg by mouth daily.    . Cholecalciferol (VITAMIN D3) 5000 units CAPS Take 5,000 Units by mouth daily.    . CHOLINE PO Take 20 mg by mouth daily.    . Chromium Picolinate (CHROMIUM PICOLATE PO) Take 200 mcg by mouth daily.     Marland Kitchen ibuprofen (ADVIL,MOTRIN) 200 MG tablet Take 200-400 mg by mouth every 8 (eight) hours as needed for moderate pain.    . INOSITOL PO Take 50 mg by mouth daily.    Boris Lown Oil 1000 MG CAPS Take 1,000 mg by mouth daily.     Marland Kitchen MAGNESIUM PO Take 500 mg by mouth daily.     . Milk Thistle 175 MG CAPS Take 175 mg by mouth daily.     . Misc Natural Products (SAW PALMETTO) CAPS Take 500 mg by mouth daily.     . Niacin (VITAMIN B-3 PO) Take 50 mg by mouth daily.    Marland Kitchen OVER THE COUNTER MEDICATION Take 200 mcg by mouth daily. OTC Folate 200 mcg    . oxyCODONE-acetaminophen (PERCOCET/ROXICET) 5-325 MG tablet Take 1 tablet by mouth every 6 (six) hours as needed for severe pain. 30 tablet 0  . P-Aminobenzoic Acid (PABA) 100 MG TABS Take 25 mg by mouth daily.    . Pantothenic Acid 250 MG CAPS Take 125 mg by mouth daily.    . Riboflavin (VITAMIN B-2) 25 MG TABS Take 25 mg by mouth daily.    Marland Kitchen  thiamine (VITAMIN B-1) 50 MG tablet Take 25 mg by mouth daily.    Marland Kitchen. Ubiquinol 100 MG CAPS Take 100 mg by mouth daily.     . vitamin A 1610910000 UNIT capsule Take 10,000 Units by mouth daily.     . vitamin B-12 (CYANOCOBALAMIN) 250 MCG tablet Take 125 mcg by mouth daily.    . vitamin B-6 (PYRIDOXINE) 25 MG tablet Take 25 mg by mouth daily.    . vitamin C (ASCORBIC ACID) 500 MG tablet Take 500 mg by mouth daily.     No current facility-administered medications for this visit.     Allergies:   Morphine and related; Doxycycline; Hydrocodone; and Septra [bactrim]   Social History:  The patient  reports that he has never smoked. He has never used smokeless tobacco. He reports current alcohol use of about 6.0 standard drinks of alcohol per week. He reports that he does not use drugs.   Family  History:  The patient's family history includes Cancer in his mother; Diabetes in his maternal grandmother and mother; Heart disease in his father and paternal grandmother; Hyperlipidemia in his father; Mental illness in his brother and mother.    ROS:  Please see the history of present illness. All other systems are reviewed and negative.   PHYSICAL EXAM: VS:  BP 122/72   Pulse 88   Ht 5\' 4"  (1.626 m)   Wt 230 lb (104.3 kg)   BMI 39.48 kg/m  , BMI Body mass index is 39.48 kg/m. GEN: Well nourished, well developed, in no acute distress  HEENT: normal  Neck: no JVD, carotid bruits, or masses Cardiac: irregularly irregular; no murmurs, rubs, or gallops,no edema, 2+pedal pulses bilaterally  Respiratory:  clear to auscultation bilaterally, normal work of breathing GI: soft, nontender, nondistended, + BS MS: no deformity or atrophy  Skin: warm and dry Neuro:  Strength and sensation are intact Psych: euthymic mood, full affect  EKG:  EKG is ordered today. Personal review of the ekg ordered shows atrial fibrillation HR 88, QRS 94, QTc 442   Recent Labs: No results found for requested labs within last 8760 hours.    Lipid Panel     Component Value Date/Time   CHOL 206 (H) 08/12/2013 1632   TRIG 105 08/12/2013 1632   HDL 53 08/12/2013 1632   CHOLHDL 3.9 08/12/2013 1632   VLDL 21 08/12/2013 1632   LDLCALC 132 (H) 08/12/2013 1632     Wt Readings from Last 3 Encounters:  04/15/18 230 lb (104.3 kg)  01/01/17 226 lb 12.8 oz (102.9 kg)  08/24/16 230 lb (104.3 kg)      Other studies Reviewed: Additional studies/ records that were reviewed today include: TTE 08/23/15 Review of the above records today demonstrates:  - Left ventricle: The cavity size was normal. Wall thickness was   increased in a pattern of mild LVH. Systolic function was normal.   The estimated ejection fraction was in the range of 60% to 65%.   Indeterminant diastolic function (atrial fibrillation). Wall    motion was normal; there were no regional wall motion   abnormalities. - Aortic valve: There was no stenosis. There was trivial   regurgitation. - Aorta: Borderline dilated aortic root. Aortic root dimension: 37   mm (ED). - Mitral valve: There was trivial regurgitation. - Left atrium: The atrium was severely dilated. - Right ventricle: The cavity size was normal. Systolic function   was normal. - Right atrium: The atrium was mildly dilated. - Tricuspid  valve: Peak RV-RA gradient (S): 24 mm Hg. - Pulmonary arteries: PA peak pressure: 27 mm Hg (S). - Inferior vena cava: The vessel was normal in size. The   respirophasic diameter changes were in the normal range (>= 50%),   consistent with normal central venous pressure.   ASSESSMENT AND PLAN:  1.  Long standing persistent Atrial fibrillation: Patient feels that he is having more symptoms over the last few months of shortness of breath and fatigue. It has been over two years since we have done a TTE, we Shakaya Bhullar repeat this. We discussed therapeutic options for him including medications and cardioversion. Patient would like to wait for the echo results before making any decisions. We dicussed his condition in general and the fact that he has been in afib for so long that maintaining sinus rhythm may be difficult. He expresses understanding.  Due to his low CHADS2VASc score of 1, have elected not to anticoagulate. 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 I have discussed with him the risk of stroke, which is 0.6% per year.  This patients CHA2DS2-VASc Score and unadjusted Ischemic Stroke Rate (% per year) is equal to 0.6 % stroke rate/year from a score of 1  Above score calculated as 1 point each if present [CHF, HTN, DM, Vascular=MI/PAD/Aortic Plaque, Age if 65-74, or Male] Above score calculated as 2 points each if present [Age > 75, or Stroke/TIA/TE]  2. Paresthesia in  feet He reports that over the last several months he has had a "pins and needles" sensation in his toes on both feet. Pulses normal. Jazmynn Pho check Bmet, CBC, and A1c Cumi Sanagustin also get him referred to PCP  Current medicines are reviewed at length with the patient today.   The patient does not have concerns regarding his medicines.  The following changes were made today:  none  Labs/ tests ordered today include:  Orders Placed This Encounter  Procedures  . CBC  . Basic metabolic panel  . HgB A1c  . EKG 12-Lead  . ECHOCARDIOGRAM COMPLETE     Disposition:   FU with Draylen Lobue 3 months  Signed, Dutchess Crosland Jorja Loa, MD  04/15/2018 10:15 AM     [facility] [address] [place] [address] [phone] (office) [phone] (fax)  I have seen and examined this patient with Jorja Loa.  Agree with above, note added to reflect my findings.  On exam, iRRR, no murmurs, lungs clear.  Currently he is complaining of weakness, fatigue, and shortness of breath.  He has been in atrial fibrillation for at least the last 2 years.  I am concerned that his symptoms are due to atrial fibrillation, but with the length of time in atrial fibrillation, it would likely be difficult to maintain sinus rhythm.  We Torion Hulgan get an echocardiogram to evaluate his left atrial size.  He is also having some numbness and tingling in his feet.  We Kaytlynn Kochan get a BMP, hemoglobin A1c, and CBC and refer him to primary care.  Shelah Heatley M. Isidora Laham MD 04/15/2018 10:15 AM

## 2018-05-02 ENCOUNTER — Ambulatory Visit (HOSPITAL_COMMUNITY): Payer: BLUE CROSS/BLUE SHIELD | Attending: Cardiology

## 2018-05-02 ENCOUNTER — Other Ambulatory Visit: Payer: Self-pay

## 2018-05-02 DIAGNOSIS — I4811 Longstanding persistent atrial fibrillation: Secondary | ICD-10-CM | POA: Diagnosis not present

## 2018-05-23 DIAGNOSIS — H0102A Squamous blepharitis right eye, upper and lower eyelids: Secondary | ICD-10-CM | POA: Diagnosis not present

## 2018-05-23 DIAGNOSIS — H0102B Squamous blepharitis left eye, upper and lower eyelids: Secondary | ICD-10-CM | POA: Diagnosis not present

## 2018-05-23 DIAGNOSIS — H16223 Keratoconjunctivitis sicca, not specified as Sjogren's, bilateral: Secondary | ICD-10-CM | POA: Diagnosis not present

## 2018-06-13 ENCOUNTER — Other Ambulatory Visit: Payer: Self-pay

## 2018-06-13 ENCOUNTER — Ambulatory Visit (INDEPENDENT_AMBULATORY_CARE_PROVIDER_SITE_OTHER): Payer: BLUE CROSS/BLUE SHIELD

## 2018-06-13 ENCOUNTER — Ambulatory Visit: Payer: BLUE CROSS/BLUE SHIELD | Admitting: Emergency Medicine

## 2018-06-13 ENCOUNTER — Encounter: Payer: Self-pay | Admitting: Emergency Medicine

## 2018-06-13 VITALS — BP 124/77 | HR 91 | Temp 97.3°F | Resp 16 | Ht 63.0 in | Wt 229.4 lb

## 2018-06-13 DIAGNOSIS — R059 Cough, unspecified: Secondary | ICD-10-CM

## 2018-06-13 DIAGNOSIS — R6889 Other general symptoms and signs: Secondary | ICD-10-CM

## 2018-06-13 DIAGNOSIS — J22 Unspecified acute lower respiratory infection: Secondary | ICD-10-CM

## 2018-06-13 DIAGNOSIS — R05 Cough: Secondary | ICD-10-CM

## 2018-06-13 LAB — POCT INFLUENZA A/B
INFLUENZA B, POC: NEGATIVE
Influenza A, POC: NEGATIVE

## 2018-06-13 MED ORDER — BENZONATATE 200 MG PO CAPS: 200.0000 mg | ORAL_CAPSULE | Freq: Two times a day (BID) | ORAL | 0 refills | Status: DC | PRN

## 2018-06-13 MED ORDER — PROMETHAZINE-DM 6.25-15 MG/5ML PO SYRP: 5.0000 mL | ORAL_SOLUTION | Freq: Four times a day (QID) | ORAL | 0 refills | Status: DC | PRN

## 2018-06-13 MED ORDER — AZITHROMYCIN 250 MG PO TABS: ORAL_TABLET | ORAL | 0 refills | Status: DC

## 2018-06-13 NOTE — Patient Instructions (Addendum)
     If you have lab work done today you will be contacted with your lab results within the next 2 weeks.  If you have not heard from us then please contact us. The fastest way to get your results is to register for My Chart.   IF you received an x-ray today, you will receive an invoice from [facility]. Please contact [facility] at [phone] with questions or concerns regarding your invoice.   IF you received labwork today, you will receive an invoice from [facility]. Please contact [facility] at [phone] with questions or concerns regarding your invoice.   Our billing staff will not be able to assist you with questions regarding bills from these companies.  You will be contacted with the lab results as soon as they are available. The fastest way to get your results is to activate your My Chart account. Instructions are located on the last page of this paperwork. If you have not heard from us regarding the results in 2 weeks, please contact this office.     Cough, Adult  A cough helps to clear your throat and lungs. A cough may last only 2-3 weeks (acute), or it may last longer than 8 weeks (chronic). Many different things can cause a cough. A cough may be a sign of an illness or another medical condition. Follow these instructions at home:  Pay attention to any changes in your cough.  Take medicines only as told by your doctor. ? If you were prescribed an antibiotic medicine, take it as told by your doctor. Do not stop taking it even if you start to feel better. ? Talk with your doctor before you try using a cough medicine.  Drink enough fluid to keep your pee (urine) clear or pale yellow.  If the air is dry, use a cold steam vaporizer or humidifier in your home.  Stay away from things that make you cough at work or at home.  If your cough is worse at night, try using extra pillows to raise your head up higher while you sleep.  Do not smoke, and try not to  be around smoke. If you need help quitting, ask your doctor.  Do not have caffeine.  Do not drink alcohol.  Rest as needed. Contact a doctor if:  You have new problems (symptoms).  You cough up yellow fluid (pus).  Your cough does not get better after 2-3 weeks, or your cough gets worse.  Medicine does not help your cough and you are not sleeping well.  You have pain that gets worse or pain that is not helped with medicine.  You have a fever.  You are losing weight and you do not know why.  You have night sweats. Get help right away if:  You cough up blood.  You have trouble breathing.  Your heartbeat is very fast. This information is not intended to replace advice given to you by your health care provider. Make sure you discuss any questions you have with your health care provider. Document Released: 12/28/2010 Document Revised: 09/22/2015 Document Reviewed: 06/23/2014 Elsevier Interactive Patient Education  2019 [employer]  

## 2018-06-13 NOTE — Progress Notes (Signed)
William Dodson 68 y.o.   Chief Complaint  Patient presents with  . flu like sympthoms    fever, chills, bodyache, cough and chest hurt from all the coughing. Hx of pnuemonia    HISTORY OF PRESENT ILLNESS: This is a 68 y.o. male complaining of cough and flulike symptoms for 1 week.  Has a history of pneumonia in the past.  Fever last night.  HPI   Prior to Admission medications   Medication Sig Start Date End Date Taking? Authorizing Provider  b complex vitamins tablet Take 1 tablet by mouth daily.    Yes [provider]  BIOTIN PO Take 50 mcg by mouth daily.   Yes [provider]  Cholecalciferol (VITAMIN D3) 5000 units CAPS Take 5,000 Units by mouth daily.   Yes [provider]  CHOLINE PO Take 20 mg by mouth daily.   Yes [provider]  Chromium Picolinate (CHROMIUM PICOLATE PO) Take 200 mcg by mouth daily.    Yes [provider]  ibuprofen (ADVIL,MOTRIN) 200 MG tablet Take 200-400 mg by mouth every 8 (eight) hours as needed for moderate pain.   Yes [provider]  INOSITOL PO Take 50 mg by mouth daily.   Yes [provider]  Boris [place]own Oil 1000 MG CAPS Take 1,000 mg by mouth daily.    Yes [provider]  MAGNESIUM PO Take 500 mg by mouth daily.    Yes [provider]  Milk Thistle 175 MG CAPS Take 175 mg by mouth daily.    Yes [provider]  Misc Natural Products (SAW PALMETTO) CAPS Take 500 mg by mouth daily.    Yes [provider]  Niacin (VITAMIN B-3 PO) Take 50 mg by mouth daily.   Yes [provider]  OVER THE COUNTER MEDICATION Take 200 mcg by mouth daily. OTC Folate 200 mcg   Yes [provider]  oxyCODONE-acetaminophen (PERCOCET/ROXICET) 5-325 MG tablet Take 1 tablet by mouth every 6 (six) hours as needed for severe pain. 08/24/16  Yes Berna Bue, MD  P-Aminobenzoic Acid (PABA) 100 MG TABS Take 25 mg by mouth daily.   Yes [provider]    Pantothenic Acid 250 MG CAPS Take 125 mg by mouth daily.   Yes [provider]  Riboflavin (VITAMIN B-2) 25 MG TABS Take 25 mg by mouth daily.   Yes [provider]  thiamine (VITAMIN B-1) 50 MG tablet Take 25 mg by mouth daily.   Yes [provider]  Ubiquinol 100 MG CAPS Take 100 mg by mouth daily.    Yes [provider]  vitamin A 40981 UNIT capsule Take 10,000 Units by mouth daily.    Yes [provider]  vitamin B-12 (CYANOCOBALAMIN) 250 MCG tablet Take 125 mcg by mouth daily.   Yes [provider]  vitamin B-6 (PYRIDOXINE) 25 MG tablet Take 25 mg by mouth daily.   Yes [provider]  vitamin C (ASCORBIC ACID) 500 MG tablet Take 500 mg by mouth daily.   Yes [provider]    Allergies  Allergen Reactions  . Morphine And Related Other (See Comments)    Increased hearing, hallucinations, weird feeling  . Doxycycline Itching and Rash       . Hydrocodone Itching and Rash  . Septra [Bactrim] Itching and Rash         Patient Active Problem List   Diagnosis Date Noted  . Obstructive sleep apnea 06/28/2011  . Sleep  apnea 01/29/2011  . 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 ONYCHOMYCOSIS 06/27/2006  . HYPERLIPIDEMIA 06/27/2006  . ATRIAL FIBRILLATION 06/27/2006    Past Medical History:  Diagnosis Date  . Atrial fibrillation (HCC)    persistent,  s/p PVI by Dr Delena Serve at [facility]  . Myalgia   . 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(HCC)    "heart infection" in setting of pneumonia 1993  . Sleep apnea    recent sleep study, results pending    Past Surgical History:  Procedure Laterality Date  . ATRIAL ABLATION SURGERY  1999   [facility]ib ablation [facility] by Ronn Melena  . VENTRAL HERNIA REPAIR N/A 08/24/2016   Procedure: LAPAROSCOPIC VENTRAL HERNIA REPAIR WITH MESH;  Surgeon: Berna Bue, MD;  Location: [facility] OR;  Service: General;  Laterality: N/A;  Laparoscopic Repair of Umbilical and  Supraumbilical Hernia with Mesh     Social History   Socioeconomic History  . Marital status: Single    Spouse name: Not on file  . Number of children: Not on file  . Years of education: Not on file  . Highest education level: Not on file  Occupational History  . Occupation: Psychologist, educational  Social Needs  . Financial resource strain: Not on file  . Food insecurity:    Worry: Not on file    Inability: Not on file  . Transportation needs:    Medical: Not on file    Non-medical: Not on file  Tobacco Use  . Smoking status: Never Smoker  . Smokeless tobacco: Never Used  . Tobacco comment: tried smoking 30 years ago  Substance and Sexual Activity  . Alcohol use: Yes    Alcohol/week: 6.0 standard drinks    Types: 6 Glasses of wine per week    Comment: 1 glass of wine at night  . Drug use: No  . Sexual activity: Not on file  Lifestyle  . Physical activity:    Days per week: Not on file    Minutes per session: Not on file  . Stress: Not on file  Relationships  . Social connections:    Talks on phone: Not on file    Gets together: Not on file    Attends religious service: Not on file    Active member of club or organization: Not on file    Attends meetings of clubs or organizations: Not on file    Relationship status: Not on file  . Intimate partner violence:    Fear of current or ex partner: Not on file    Emotionally abused: Not on file    Physically abused: Not on file    Forced sexual activity: Not on file  Other Topics Concern  . Not on file  Social History Narrative   Married   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[employer]   Two children also pursuing Art   Sedentary      Sculptor, primarily with iron    Family History  Problem Relation Age of Onset  . Diabetes Mother   . Cancer Mother   . Mental illness Mother   . Heart disease Father   . Hyperlipidemia Father   . Mental illness Brother   . Diabetes Maternal Grandmother   . Heart disease Paternal Grandmother      Review of  Systems  Constitutional: Positive for fever.  HENT: Positive for congestion. Negative for sore throat.   Eyes: Negative.   Respiratory: Positive for cough. Negative for shortness of breath and wheezing.   Cardiovascular:  Negative.  Negative for chest pain and palpitations.  Gastrointestinal: Negative.  Negative for abdominal pain, diarrhea, nausea and vomiting.  Genitourinary: Negative.   Musculoskeletal: Negative.  Negative for myalgias and neck pain.  Skin: Negative.  Negative for rash.  Neurological: Negative.  Negative for dizziness and headaches.  Endo/Heme/Allergies: Negative.     Vitals:   06/13/18 1710  BP: 124/77  Pulse: 91  Resp: 16  Temp: (!) 97.3 F (36.3 C)  SpO2: 97%    Physical Exam Vitals signs reviewed.  Constitutional:      Appearance: Normal appearance.  HENT:     Head: Normocephalic and atraumatic.     Nose: Nose normal.     Mouth/Throat:     Mouth: Mucous membranes are moist.     Pharynx: Oropharynx is clear.  Eyes:     Extraocular Movements: Extraocular movements intact.     Conjunctiva/sclera: Conjunctivae normal.     Pupils: Pupils are equal, round, and reactive to light.  Neck:     Musculoskeletal: Normal range of motion and neck supple.  Cardiovascular:     Rate and Rhythm: Normal rate and regular rhythm.     Heart sounds: Normal heart sounds.  Pulmonary:     Effort: Pulmonary effort is normal.     Breath sounds: Normal breath sounds.  Abdominal:     General: There is no distension.     Palpations: Abdomen is soft.     Tenderness: There is no abdominal tenderness.  Musculoskeletal: Normal range of motion.  Skin:    General: Skin is warm and dry.     Capillary Refill: Capillary refill takes less than 2 seconds.  Neurological:     General: No focal deficit present.     Mental Status: He is alert and oriented to person, place, and time.     Results for orders placed or performed in visit on 06/13/18 (from the past 24 hour(s))  POCT  Influenza A/B     Status: None   Collection Time: 06/13/18  5:31 PM  Result Value Ref Range   Influenza A, POC Negative Negative   Influenza B, POC Negative Negative   Dg Chest 2 View  Result Date: 06/13/2018 CLINICAL DATA:  68 y/o  M; cough, rule out pneumonia. EXAM: CHEST - 2 VIEW COMPARISON:  08/21/2016 chest radiograph FINDINGS: Stable heart size and mediastinal contours are within normal limits. Both lungs are clear. The visualized skeletal structures are unremarkable. IMPRESSION: No acute pulmonary process identified. Electronically Signed   By: Mitzi HansenLance  Furusawa-Stratton M.D.   On: 06/13/2018 17:47   A total of 25 minutes was spent in the room with the patient, greater than 50% of which was in counseling/coordination of care regarding differential diagnosis, treatment, x-ray report, medications, and need for follow-up if no better or worse next week.    ASSESSMENT & PLAN: [name] was seen today for flu like sympthoms.  Diagnoses and all orders for this visit:  Flu-like symptoms -     POCT Influenza A/B  Cough -     DG Chest 2 View -     benzonatate (TESSALON) 200 MG capsule; Take 1 capsule (200 mg total) by mouth 2 (two) times daily as needed for cough. -     promethazine-dextromethorphan (PROMETHAZINE-DM) 6.25-15 MG/5ML syrup; Take 5 mLs by mouth 4 (four) times daily as needed for cough.  Lower respiratory infection -     azithromycin (ZITHROMAX) 250 MG tablet; Sig as indicated    Patient Instructions  If you have lab work done today you will be contacted with your lab results within the next 2 weeks.  If you have not heard from [place] then please contact us. The fastest way to get your results is to register for My Chart.   IF you received an x-ray today, you will receive an invoice from [facility]. Please contact [facility] at [phone] with questions or concerns regarding your invoice.   IF you received labwork today, you will receive an  invoice from [facility]. Please contact [facility] at [phone] with questions or concerns regarding your invoice.   Our billing staff will not be able to assist you with questions regarding bills from these companies.  You will be contacted with the lab results as soon as they are available. The fastest way to get your results is to activate your My Chart account. Instructions are located on the last page of this paperwork. If you have not heard from [place] regarding the results in 2 weeks, please contact this office.      Cough, Adult  A cough helps to clear your throat and lungs. A cough may last only 2-3 weeks (acute), or it may last longer than 8 weeks (chronic). Many different things can cause a cough. A cough may be a sign of an illness or another medical condition. Follow these instructions at home:  Pay attention to any changes in your cough.  Take medicines only as told by your doctor. ? If you were prescribed an antibiotic medicine, take it as told by your doctor. Do not stop taking it even if you start to feel better. ? Talk with your doctor before you try using a cough medicine.  Drink enough fluid to keep your pee (urine) clear or pale yellow.  If the air is dry, use a cold steam vaporizer or humidifier in your home.  Stay away from things that make you cough at work or at home.  If your cough is worse at night, try using extra pillows to raise your head up higher while you sleep.  Do not smoke, and try not to be around smoke. If you need help quitting, ask your doctor.  Do not have caffeine.  Do not drink alcohol.  Rest as needed. Contact a doctor if:  You have new problems (symptoms).  You cough up yellow fluid (pus).  Your cough does not get better after 2-3 weeks, or your cough gets worse.  Medicine does not help your cough and you are not sleeping well.  You have pain that gets worse or pain that is not helped with medicine.  You have a fever.  You are  losing weight and you do not know why.  You have night sweats. Get help right away if:  You cough up blood.  You have trouble breathing.  Your heartbeat is very fast. This information is not intended to replace advice given to you by your health care provider. Make sure you discuss any questions you have with your health care provider. Document Released: 12/28/2010 Document Revised: 09/22/2015 Document Reviewed: 06/23/2014 Elsevier Interactive Patient Education  2019 Elsevier Inc.      Edwina Barth, MD [facility]

## 2018-06-14 ENCOUNTER — Telehealth: Payer: Self-pay | Admitting: Emergency Medicine

## 2018-06-14 NOTE — Telephone Encounter (Signed)
Pt was seen on 06/13/18, he would like to know if he has the flu? Please advise at [phone]

## 2018-06-16 NOTE — Telephone Encounter (Signed)
LVM for pt to call office about concerns, informed him that when he was seen in the office the test was negative for Flu A and B.

## 2018-07-18 ENCOUNTER — Encounter: Payer: Self-pay | Admitting: *Deleted

## 2018-07-18 ENCOUNTER — Ambulatory Visit: Payer: BLUE CROSS/BLUE SHIELD | Admitting: Cardiology

## 2018-07-18 ENCOUNTER — Telehealth: Payer: Self-pay | Admitting: Cardiology

## 2018-07-18 NOTE — Telephone Encounter (Signed)
Pt aware office will call him to arrange an OV w/ Camnitz after COVID-19 concerns diminish.

## 2018-07-18 NOTE — Telephone Encounter (Signed)
Called patient today to discuss longstanding persistent atrial fibrillation.  Told him that his echocardiogram shows a low normal ejection fraction with a mildly dilated left atrium.  I Marcela Alatorre have to discuss this with our echo readers as his left atrium was severely dilated in 2017.  If his left atrium is mildly dilated, Laquetta Racey potentially plan for a rhythm control strategy with cardioversion.  He would like to avoid medications.  We Lanaiya Lantry hold off on this until the next visit.  Loman Brooklyn, MD

## 2018-10-06 DIAGNOSIS — H0102A Squamous blepharitis right eye, upper and lower eyelids: Secondary | ICD-10-CM | POA: Diagnosis not present

## 2018-10-06 DIAGNOSIS — H16223 Keratoconjunctivitis sicca, not specified as Sjogren's, bilateral: Secondary | ICD-10-CM | POA: Diagnosis not present

## 2018-10-06 DIAGNOSIS — H0102B Squamous blepharitis left eye, upper and lower eyelids: Secondary | ICD-10-CM | POA: Diagnosis not present

## 2018-10-21 NOTE — Telephone Encounter (Signed)
Pt scheduled for 7/7 OV. Offered sooner virtual appt to discuss but pt said he would prefer to wait until seeing Camnitz in-office. Pt will call the office if symptoms worsen prior to appt.

## 2018-11-03 ENCOUNTER — Telehealth: Payer: Self-pay | Admitting: Cardiology

## 2018-11-03 NOTE — Telephone Encounter (Signed)
New Message          COVID-19 Pre-Screening Questions:   In the past 7 to 10 days have you had a cough,  shortness of breath, headache, congestion, fever (100 or greater) body aches, chills, sore throat, or sudden loss of taste or sense of smell? NO  Have you been around anyone with known Covid 19. YES, he has recovered, last Wednesday is when he found out he had it but he did recover   Have you been around anyone who is awaiting Covid 19 test results in the past 7 to 10 days? 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 YES  If you have any concerns/questions about symptoms patients report during screening (either on the phone or at threshold). Contact the provider seeing the patient or DOD for further guidance.  If neither are available contact a member of the leadership team.

## 2018-11-03 NOTE — Telephone Encounter (Signed)
Followed up w/ pt. Pt is currently waiting for COVID results to come back. Pt informed that we would need to reschedule in-office visit d/t possible exposure. Pt tells me that he is feeling better, had issues r/t to AFib around middle of last month, but none since then.  Doesn't think he needs to discuss ablation right now and would prefer to wait. States he was actually considering cancelling appt tomorrow anyway.  Informed pt that he still needed a 15mo f/u appt, so we scheduled him for 8/18.  Pt agreeable to calling back if he has issues b/t now and then. Pt thanks me for return call.

## 2018-11-04 ENCOUNTER — Ambulatory Visit: Payer: BLUE CROSS/BLUE SHIELD | Admitting: Cardiology

## 2018-11-07 ENCOUNTER — Encounter: Payer: Self-pay | Admitting: Emergency Medicine

## 2018-11-11 ENCOUNTER — Other Ambulatory Visit: Payer: Self-pay

## 2018-11-11 ENCOUNTER — Encounter: Payer: Self-pay | Admitting: Emergency Medicine

## 2018-11-11 ENCOUNTER — Ambulatory Visit: Payer: BC Managed Care – PPO | Admitting: Emergency Medicine

## 2018-11-11 VITALS — BP 134/76 | HR 84 | Temp 98.5°F | Resp 16 | Ht 63.0 in | Wt 225.0 lb

## 2018-11-11 DIAGNOSIS — W57XXXA Bitten or stung by nonvenomous insect and other nonvenomous arthropods, initial encounter: Secondary | ICD-10-CM | POA: Diagnosis not present

## 2018-11-11 DIAGNOSIS — L089 Local infection of the skin and subcutaneous tissue, unspecified: Secondary | ICD-10-CM

## 2018-11-11 MED ORDER — AMOXICILLIN-POT CLAVULANATE 875-125 MG PO TABS: 1.0000 | ORAL_TABLET | Freq: Two times a day (BID) | ORAL | 0 refills | Status: AC

## 2018-11-11 NOTE — Patient Instructions (Addendum)
   If you have lab work done today you will be contacted with your lab results within the next 2 weeks.  If you have not heard from us then please contact us. The fastest way to get your results is to register for My Chart.   IF you received an x-ray today, you will receive an invoice from [facility]. Please contact  Radiology at [phone] with questions or concerns regarding your invoice.   IF you received labwork today, you will receive an invoice from [facility]. Please contact [facility] at [phone] with questions or concerns regarding your invoice.   Our billing staff will not be able to assist you with questions regarding bills from these companies.  You will be contacted with the lab results as soon as they are available. The fastest way to get your results is to activate your My Chart account. Instructions are located on the last page of this paperwork. If you have not heard from us regarding the results in 2 weeks, please contact this office.     Tick Bite Information, Adult  Ticks are insects that can bite. Most ticks live in shrubs and grassy areas. They climb onto people and animals that go by. Then they bite. Some ticks carry germs that can make you sick. How can I prevent tick bites?  Use an insect repellent that has 20% or higher of the ingredients DEET, picaridin, or IR3535. Put this insect repellent on: ? Bare skin. ? The tops of your boots. ? Your pant legs. ? The ends of your sleeves.  If you use an insect repellent that has the ingredient permethrin, make sure to follow the instructions on the bottle. Treat the following: ? Clothing. ? Supplies. ? Boots. ? Tents.  Wear long sleeves, long pants, and light colors.  Tuck your pant legs into your socks.  Stay in the middle of the trail.  Try not to walk through long grass.  Before going inside your house, check your clothes, hair, and skin for ticks. Make sure to check your head, neck,  armpits, waist, groin, and joint areas.  Check for ticks every day.  When you come indoors: ? Wash your clothes right away. ? Shower right away. ? Dry your clothes in a dryer on high heat for 60 minutes or more. What is the right way to remove a tick? Remove a tick from your skin as soon as possible.  To remove a tick that is crawling on your skin: ? Go outdoors and brush the tick off. ? Use tape or a lint roller.  To remove a tick that is biting: ? Wash your hands. ? If you have latex gloves, put them on. ? Use tweezers, curved forceps, or a tick-removal tool to grasp the tick. Grasp the tick as close to your skin and as close to the tick's head as possible. ? Gently pull up until the tick lets go.  Try to keep the tick's head attached to its body.  Do not twist or jerk the tick.  Do not squeeze or crush the tick. Do not try to remove a tick with heat, alcohol, petroleum jelly, or fingernail polish. How should I get rid of a tick? Here are some ways to get rid of a tick that is alive:  Place the tick in rubbing alcohol.  Place the tick in a bag or container you can close tightly.  Wrap the tick tightly in tape.  Flush the tick down the toilet.   Contact a doctor if:  You have symptoms of a disease, such as: ? Pain in a muscle, joint, or bone. ? Trouble walking or moving your legs. ? Numbness in your legs. ? Inability to move (paralysis). ? A red rash that makes a circle (bull's-eye rash). ? Redness and swelling where the tick bit you. ? A fever. ? Throwing up (vomiting) over and over. ? Diarrhea. ? Weight loss. ? Tender and swollen lymph glands. ? Shortness of breath. ? Cough. ? Belly pain (abdominal pain). ? Headache. ? Being more tired than normal. ? A change in how alert (conscious) you are. ? Confusion. Get help right away if:  You cannot remove a tick.  A part of a tick breaks off and gets stuck in your skin.  You are feeling  worse. Summary  Ticks may carry germs that can make you sick.  To prevent tick bites, wear long sleeves, long pants, and light colors. Use insect repellent. Follow the instructions on the bottle.  If the tick is biting, do not try to remove it with heat, alcohol, petroleum jelly, or fingernail polish.  Use tweezers, curved forceps, or a tick-removal tool to grasp the tick. Gently pull up until the tick lets go. Do not twist or jerk the tick. Do not squeeze or crush the tick.  If you have symptoms, contact a doctor. This information is not intended to replace advice given to you by your health care provider. Make sure you discuss any questions you have with your health care provider. Document Released: 07/11/2009 Document Revised: 03/31/2018 Document Reviewed: 07/27/2016 Elsevier Patient Education  2020 Elsevier Inc.  

## 2018-11-11 NOTE — Progress Notes (Signed)
William Dodson 67 y.o.   Chief Complaint  Patient presents with  . Tick Removal    1 wk , left shoukder, pt states he feels fine    HISTORY OF PRESENT ILLNESS: This is a 68 y.o. male complaining of tick bite to left shoulder area sustained 1 week ago, complaining of redness and swelling to the area.  No other significant symptoms.  HPI   Prior to Admission medications   Medication Sig Start Date End Date Taking? Authorizing Provider  b complex vitamins tablet Take 1 tablet by mouth daily.    Yes [provider]  BIOTIN PO Take 50 mcg by mouth daily.   Yes [provider]  Cholecalciferol (VITAMIN D3) 5000 units CAPS Take 5,000 Units by mouth daily.   Yes [provider]  CHOLINE PO Take 20 mg by mouth daily.   Yes [provider]  Chromium Picolinate (CHROMIUM PICOLATE PO) Take 200 mcg by mouth daily.    Yes [provider]  Folic Acid (FOLATE PO) Take 200 mcg by mouth daily.   Yes [provider]  ibuprofen (ADVIL,MOTRIN) 200 MG tablet Take 200-400 mg by mouth every 8 (eight) hours as needed for moderate pain.   Yes [provider]  INOSITOL PO Take 50 mg by mouth daily.   Yes [provider]  Javier Docker Oil 1000 MG CAPS Take 1,000 mg by mouth daily.    Yes [provider]  MAGNESIUM PO Take 500 mg by mouth daily.    Yes [provider]  Milk Thistle 175 MG CAPS Take 175 mg by mouth daily.    Yes [provider]  Misc Natural Products (SAW PALMETTO) CAPS Take 500 mg by mouth daily.    Yes [provider]  Niacin (VITAMIN B-3 PO) Take 50 mg by mouth daily.   Yes [provider]  P-Aminobenzoic Acid (PABA) 100 MG TABS Take 25 mg by mouth daily.   Yes [provider]  Pantothenic Acid 250 MG CAPS Take 125 mg by mouth daily.   Yes [provider]  Riboflavin (VITAMIN B-2) 25 MG TABS Take 25 mg by mouth daily.   Yes [provider]  thiamine  (VITAMIN B-1) 50 MG tablet Take 25 mg by mouth daily.   Yes [provider]  Ubiquinol 100 MG CAPS Take 100 mg by mouth daily.    Yes [provider]  vitamin A 10000 UNIT capsule Take 10,000 Units by mouth daily.    Yes [provider]  vitamin B-12 (CYANOCOBALAMIN) 250 MCG tablet Take 125 mcg by mouth daily.   Yes [provider]  vitamin B-6 (PYRIDOXINE) 25 MG tablet Take 25 mg by mouth daily.   Yes [provider]  vitamin C (ASCORBIC ACID) 500 MG tablet Take 500 mg by mouth daily.   Yes [provider]    Allergies  Allergen Reactions  . Morphine And Related Other (See Comments)    Increased hearing, hallucinations, weird feeling  . Doxycycline Itching and Rash       . Hydrocodone Itching and Rash  . Septra [Bactrim] Itching and Rash         Patient Active Problem List   Diagnosis Date Noted  . Obstructive sleep apnea 06/28/2011  . Sleep apnea 01/29/2011  . 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 ONYCHOMYCOSIS 06/27/2006  . HYPERLIPIDEMIA 06/27/2006  . ATRIAL FIBRILLATION 06/27/2006    Past Medical History:  Diagnosis Date  .  Atrial fibrillation (HCC)    persistent,  [facility]  . Myalgia   . 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(HCC)    "heart infection" in setting of pneumonia 1993  . Sleep apnea    recent sleep study, results pending    Past Surgical History:  Procedure Laterality Date  . ATRIAL ABLATION SURGERY  1999   [facility] by Ronn MelenaMarcus Wharton  . VENTRAL HERNIA REPAIR N/A 08/24/2016   Procedure: LAPAROSCOPIC VENTRAL HERNIA REPAIR WITH MESH;  Surgeon: Berna Buehelsea A Connor, MD;  Location: [facility] OR;  Service: General;  Laterality: N/A;  Laparoscopic Repair of Umbilical and Supraumbilical Hernia with Mesh     Social History   Socioeconomic History  . Marital status: Single    Spouse name: Not on file  . Number of children: Not on file  . Years of education: Not on file   . Highest education level: Not on file  Occupational History  . Occupation: Psychologist, educationalculptor  Social Needs  . Financial resource strain: Not on file  . Food insecurity    Worry: Not on file    Inability: Not on file  . Transportation needs    Medical: Not on file    Non-medical: Not on file  Tobacco Use  . Smoking status: Never Smoker  . Smokeless tobacco: Never Used  . Tobacco comment: tried smoking 30 years ago  Substance and Sexual Activity  . Alcohol use: Yes    Alcohol/week: 6.0 standard drinks    Types: 6 Glasses of wine per week    Comment: 1 glass of wine at night  . Drug use: No  . Sexual activity: Not on file  Lifestyle  . Physical activity    Days per week: Not on file    Minutes per session: Not on file  . Stress: Not on file  Relationships  . Social Musicianconnections    Talks on phone: Not on file    Gets together: Not on file    Attends religious service: Not on file    Active member of club or organization: Not on file    Attends meetings of clubs or organizations: Not on file    Relationship status: Not on file  . Intimate partner violence    Fear of current or ex partner: Not on file    Emotionally abused: Not on file    Physically abused: Not on file    Forced sexual activity: Not on file  Other Topics Concern  . Not on file  Social History Narrative   Married   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[employer]   Two children also pursuing Art   Sedentary      Sculptor, primarily with iron    Family History  Problem Relation Age of Onset  . Diabetes Mother   . Cancer Mother   . Mental illness Mother   . Heart disease Father   . Hyperlipidemia Father   . Mental illness Brother   . Diabetes Maternal Grandmother   . Heart disease Paternal Grandmother      Review of Systems  Constitutional: Negative.  Negative for chills and fever.  HENT: Negative.  Negative for sore throat.   Respiratory: Negative for cough and shortness of breath.   Cardiovascular: Negative for chest pain  and palpitations.  Gastrointestinal: Negative for abdominal pain, diarrhea, nausea and vomiting.  Skin: Positive for rash.  Neurological: Negative for dizziness and headaches.  All other systems reviewed and are negative.   Vitals:  11/11/18 1548  BP: 134/76  Pulse: 84  Resp: 16  Temp: 98.5 F (36.9 C)  SpO2: 96%    Physical Exam Vitals signs reviewed.  Constitutional:      Appearance: Normal appearance.  HENT:     Head: Normocephalic and atraumatic.  Eyes:     Extraocular Movements: Extraocular movements intact.     Pupils: Pupils are equal, round, and reactive to light.  Neck:     Musculoskeletal: Normal range of motion and neck supple.  Cardiovascular:     Rate and Rhythm: Normal rate and regular rhythm.  Pulmonary:     Effort: Pulmonary effort is normal.  Skin:    General: Skin is warm and dry.     Capillary Refill: Capillary refill takes less than 2 seconds.     Findings: Rash present.     Comments: Left shoulder area: Positive raised and erythematous area without fluctuation or significant tenderness.  Central scab visible  Neurological:     General: No focal deficit present.     Mental Status: He is alert and oriented to person, place, and time.  Psychiatric:        Mood and Affect: Mood normal.        Behavior: Behavior normal.      ASSESSMENT & PLAN: William FearingJames was seen today for tick removal.  Diagnoses and all orders for this visit:  Skin infection -     amoxicillin-clavulanate (AUGMENTIN) 875-125 MG tablet; Take 1 tablet by mouth 2 (two) times daily for 7 days.  Tick bite, initial encounter -     amoxicillin-clavulanate (AUGMENTIN) 875-125 MG tablet; Take 1 tablet by mouth 2 (two) times daily for 7 days.    Patient Instructions       If you have lab work done today you will be contacted with your lab results within the next 2 weeks.  If you have not heard from us then please contact us. The fastest way to get your results is to register for My  Chart.   IF you received an x-ray today, you will receive an invoice from [facility]. Please contact [facility] at [phone] with questions or concerns regarding your invoice.   IF you received labwork today, you will receive an invoice from [facility]. Please contact [facility] at [phone] with questions or concerns regarding your invoice.   Our billing staff will not be able to assist you with questions regarding bills from these companies.  You will be contacted with the lab results as soon as they are available. The fastest way to get your results is to activate your My Chart account. Instructions are located on the last page of this paperwork. If you have not heard from us regarding the results in 2 weeks, please contact this office.      Tick Bite Information, Adult  Ticks are insects that can bite. Most ticks live in shrubs and grassy areas. They climb onto people and animals that go by. Then they bite. Some ticks carry germs that can make you sick. How can I prevent tick bites?  Use an insect repellent that has 20% or higher of the ingredients DEET, picaridin, or IR3535. Put this insect repellent on: ? Bare skin. ? The tops of your boots. ? Your pant legs. ? The ends of your sleeves.  If you use an insect repellent that has the ingredient permethrin, make sure to follow the instructions on the bottle. Treat the following: ? Clothing. ? Supplies. ? Boots. ?  Tents.  Wear long sleeves, long pants, and light colors.  Tuck your pant legs into your socks.  Stay in the middle of the trail.  Try not to walk through long grass.  Before going inside your house, check your clothes, hair, and skin for ticks. Make sure to check your head, neck, armpits, waist, groin, and joint areas.  Check for ticks every day.  When you come indoors: ? Wash your clothes right away. ? Shower right away. ? Dry your clothes in a dryer on high heat for 60 minutes or more.  What is the right way to remove a tick? Remove a tick from your skin as soon as possible.  To remove a tick that is crawling on your skin: ? Go outdoors and brush the tick off. ? Use tape or a lint roller.  To remove a tick that is biting: ? Wash your hands. ? If you have latex gloves, put them on. ? Use tweezers, curved forceps, or a tick-removal tool to grasp the tick. Grasp the tick as close to your skin and as close to the tick's head as possible. ? Gently pull up until the tick lets go.  Try to keep the tick's head attached to its body.  Do not twist or jerk the tick.  Do not squeeze or crush the tick. Do not try to remove a tick with heat, alcohol, petroleum jelly, or fingernail polish. How should I get rid of a tick? Here are some ways to get rid of a tick that is alive:  Place the tick in rubbing alcohol.  Place the tick in a bag or container you can close tightly.  Wrap the tick tightly in tape.  Flush the tick down the toilet. Contact a doctor if:  You have symptoms of a disease, such as: ? Pain in a muscle, joint, or bone. ? Trouble walking or moving your legs. ? Numbness in your legs. ? Inability to move (paralysis). ? A red rash that makes a circle (bull's-eye rash). ? Redness and swelling where the tick bit you. ? A fever. ? Throwing up (vomiting) over and over. ? Diarrhea. ? Weight loss. ? Tender and swollen lymph glands. ? Shortness of breath. ? Cough. ? Belly pain (abdominal pain). ? Headache. ? Being more tired than normal. ? A change in how alert (conscious) you are. ? Confusion. Get help right away if:  You cannot remove a tick.  A part of a tick breaks off and gets stuck in your skin.  You are feeling worse. Summary  Ticks may carry germs that can make you sick.  To prevent tick bites, wear long sleeves, long pants, and light colors. Use insect repellent. Follow the instructions on the bottle.  If the tick is biting, do not try to  remove it with heat, alcohol, petroleum jelly, or fingernail polish.  Use tweezers, curved forceps, or a tick-removal tool to grasp the tick. Gently pull up until the tick lets go. Do not twist or jerk the tick. Do not squeeze or crush the tick.  If you have symptoms, contact a doctor. This information is not intended to replace advice given to you by your health care provider. Make sure you discuss any questions you have with your health care provider. Document Released: 07/11/2009 Document Revised: 03/31/2018 Document Reviewed: 07/27/2016 Elsevier Patient Education  2020 Elsevier Inc.      Edwina BarthMiguel Liliahna Cudd, MD [facility]

## 2018-11-13 ENCOUNTER — Telehealth: Payer: Self-pay

## 2018-11-13 ENCOUNTER — Other Ambulatory Visit: Payer: Self-pay | Admitting: Emergency Medicine

## 2018-11-13 MED ORDER — AZITHROMYCIN 250 MG PO TABS: ORAL_TABLET | ORAL | 0 refills | Status: DC

## 2018-11-13 NOTE — Telephone Encounter (Signed)
Pt came into the clinic on 11/11/2018 for a tick bite. Pt states that you prescribed him amox-clav antibiotic. Pt states that he is allergic to penicillin. I looked on allergy list and did not see the penicillin allergy. I will update the list. Please advise.

## 2018-11-13 NOTE — Telephone Encounter (Signed)
I did not see it listed either.  Will prescribe azithromycin.  Thanks.

## 2018-11-13 NOTE — Telephone Encounter (Signed)
Pt notified Verbalizes understanding 

## 2018-12-16 ENCOUNTER — Other Ambulatory Visit: Payer: Self-pay

## 2018-12-16 ENCOUNTER — Ambulatory Visit: Payer: BC Managed Care – PPO | Admitting: Cardiology

## 2018-12-16 ENCOUNTER — Encounter: Payer: Self-pay | Admitting: Cardiology

## 2018-12-16 VITALS — BP 132/82 | HR 97 | Ht 63.0 in | Wt 222.0 lb

## 2018-12-16 DIAGNOSIS — I4811 Longstanding persistent atrial fibrillation: Secondary | ICD-10-CM

## 2018-12-16 NOTE — Progress Notes (Signed)
Electrophysiology Office Note   Date:  12/16/2018   ID:  William Dodson, DOB 1950/12/17, MRN [id number]  PCP:  Patient, No Pcp Per  Primary Electrophysiologist:  William Dodson    CC: Follow up for atrial fibrillation   History of Present Illness: William Dodson is a 68 y.o. male who presents today for electrophysiology evaluation.    He has a history of sleep apnea, atrial fibrillation status post PVI [facility] in 1997 by William. 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 Patient reports that over the last six months he has had more dyspnea on exertion and more fatigue. He notes faster heart rates when he exerts but it slows quickly when he rests. He is able to do most daily activities without issue. He also complains of numbness and tingling in his toes bilaterally. He is compliant with his CPAP therapy. He has not followed routinely with a PCP.  Today, denies symptoms of palpitations, chest pain, shortness of breath, orthopnea, PND, lower extremity edema, claudication, dizziness, presyncope, syncope, bleeding, or neurologic sequela. The patient is tolerating medications without difficulties.  Symptoms with his atrial fibrillation or weakness and fatigue.  There are days where he feels fine, but other days where he has quite a few symptoms.  He is unclear as to the cause of his symptoms overall.   Past Medical History:  Diagnosis Date  . Atrial fibrillation (HCC)    persistent,  [facility]  . Myalgia   . 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(HCC)    "heart infection" in setting of pneumonia 1993  . Sleep apnea    recent sleep study, results pending   Past Surgical History:  Procedure Laterality Date  . ATRIAL ABLATION SURGERY  1999   [facility] by William Dodson  . VENTRAL HERNIA REPAIR N/A 08/24/2016   Procedure: LAPAROSCOPIC VENTRAL HERNIA REPAIR WITH MESH;  Surgeon: William Buehelsea A Connor, MD;  Location: [facility] OR;  Service: General;  Laterality: N/A;   Laparoscopic Repair of Umbilical and Supraumbilical Hernia with Mesh      Current Outpatient Medications  Medication Sig Dispense Refill  . azithromycin (ZITHROMAX) 250 MG tablet Sig as indicated 6 tablet 0  . b complex vitamins tablet Take 1 tablet by mouth daily.     Marland Kitchen. BIOTIN PO Take 50 mcg by mouth daily.    . Cholecalciferol (VITAMIN D3) 5000 units CAPS Take 5,000 Units by mouth daily.    . CHOLINE PO Take 20 mg by mouth daily.    . Chromium Picolinate (CHROMIUM PICOLATE PO) Take 200 mcg by mouth daily.     . Folic Acid (FOLATE PO) Take 200 mcg by mouth daily.    Marland Kitchen. ibuprofen (ADVIL,MOTRIN) 200 MG tablet Take 200-400 mg by mouth every 8 (eight) hours as needed for moderate pain.    . INOSITOL PO Take 50 mg by mouth daily.    Boris Lown. Krill Oil 1000 MG CAPS Take 1,000 mg by mouth daily.     Marland Kitchen. MAGNESIUM PO Take 500 mg by mouth daily.     . Milk Thistle 175 MG CAPS Take 175 mg by mouth daily.     . Misc Natural Products (SAW PALMETTO) CAPS Take 500 mg by mouth daily.     . Niacin (VITAMIN B-3 PO) Take 50 mg by mouth daily.    Marland Kitchen. P-Aminobenzoic Acid (PABA) 100 MG TABS Take 25 mg by mouth daily.    . Pantothenic Acid 250  MG CAPS Take 125 mg by mouth daily.    . Riboflavin (VITAMIN B-2) 25 MG TABS Take 25 mg by mouth daily.    Marland Kitchen. thiamine (VITAMIN B-1) 50 MG tablet Take 25 mg by mouth daily.    Marland Kitchen. Ubiquinol 100 MG CAPS Take 100 mg by mouth daily.     . vitamin A [id number] UNIT capsule Take 10,000 Units by mouth daily.     . vitamin B-12 (CYANOCOBALAMIN) 250 MCG tablet Take 125 mcg by mouth daily.    . vitamin B-6 (PYRIDOXINE) 25 MG tablet Take 25 mg by mouth daily.    . vitamin C (ASCORBIC ACID) 500 MG tablet Take 500 mg by mouth daily.     No current facility-administered medications for this visit.     Allergies:   Morphine and related, Penicillins, Doxycycline, Hydrocodone, and Septra [bactrim]   Social History:  The patient  reports that he has never smoked. He has never used smokeless tobacco.  He reports current alcohol use of about 6.0 standard drinks of alcohol per week. He reports that he does not use drugs.   Family History:  The patient's family history includes Cancer in his mother; Diabetes in his maternal grandmother and mother; Heart disease in his father and paternal grandmother; Hyperlipidemia in his father; Mental illness in his brother and mother.    ROS:  Please see the history of present illness.   Otherwise, review of systems is positive for none.   All other systems are reviewed and negative.   PHYSICAL EXAM: VS:  BP 132/82   Pulse 97   Ht 5\' 3"  (1.6 m)   Wt 222 lb (100.7 kg)   SpO2 97%   BMI 39.33 kg/m  , BMI Body mass index is 39.33 kg/m. GEN: Well nourished, well developed, in no acute distress  HEENT: normal  Neck: no JVD, carotid bruits, or masses Cardiac: RRR; no murmurs, rubs, or gallops,no edema  Respiratory:  clear to auscultation bilaterally, normal work of breathing GI: soft, nontender, nondistended, + BS MS: no deformity or atrophy  Skin: warm and dry Neuro:  Strength and sensation are intact Psych: euthymic mood, full affect  EKG:  EKG is ordered today. Personal review of the ekg ordered shows AF   Recent Labs: No results found for requested labs within last 8760 hours.    Lipid Panel     Component Value Date/Time   CHOL 206 (H) 08/12/2013 1632   TRIG 105 08/12/2013 1632   HDL 53 08/12/2013 1632   CHOLHDL 3.9 08/12/2013 1632   VLDL 21 08/12/2013 1632   LDLCALC 132 (H) 08/12/2013 1632     Wt Readings from Last 3 Encounters:  12/16/18 222 lb (100.7 kg)  11/11/18 225 lb (102.1 kg)  06/13/18 229 lb 6.4 oz (104.1 kg)      Other studies Reviewed: Additional studies/ records that were reviewed today include: TTE 05/02/18 Review of the above records today demonstrates:  - Left ventricle: The cavity size was normal. There was mild   concentric hypertrophy. Systolic function was normal. The   estimated ejection fraction was in the  range of 50% to 55%. Wall   motion was normal; there were no regional wall motion   abnormalities. - Aortic valve: There was mild regurgitation. Regurgitation   pressure half-time: 514 ms. - Mitral valve: Transvalvular velocity was within the normal range.   There was no evidence for stenosis. There was trivial   regurgitation. - Left atrium: The atrium was  mildly dilated. - Right ventricle: Systolic function was normal. - Atrial septum: No defect or patent foramen ovale was identified   by color flow Doppler. - Tricuspid valve: There was mild regurgitation. - Pulmonary arteries: Systolic pressure was within the normal   range. PA peak pressure: 25 mm Hg (S).   ASSESSMENT AND PLAN:  1.  Long standing persistent Atrial fibrillation: He has been having more symptoms over the last few months.  He is currently not anticoagulated due to his low stroke risk.  He would prefer not to take medications, but I feel like this is likely the best option for him.  Due to that, I have talked to him about starting flecainide and diltiazem.  He would likely need a cardioversion as well.  He  think about this and let us know.  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    This patients CHA2DS2-VASc Score and unadjusted Ischemic Stroke Rate (% per year) is equal to 0.6 % stroke rate/year from a score of 1  Above score calculated as 1 point each if present [CHF, HTN, DM, Vascular=MI/PAD/Aortic Plaque, Age if 65-74, or Male] Above score calculated as 2 points each if present [Age > 75, or Stroke/TIA/TE]   Current medicines are reviewed at length with the patient today.   The patient does not have concerns regarding his medicines.  The following changes were made today:  none  Labs/ tests ordered today include:  Orders Placed This Encounter  Procedures  . EKG 12-Lead     Disposition:   FU with   3 months  Signed,   Meredith Leeds, MD  12/16/2018 2:10 PM     [place] [address] [phone] (office) [phone] (fax)

## 2018-12-16 NOTE — Patient Instructions (Addendum)
Medication Instructions:  Your physician recommends that you continue on your current medications as directed. Please refer to the Current Medication list given to you today.  * If you need a refill on your cardiac medications before your next appointment, please call your pharmacy.   Labwork: None ordered  Testing/Procedures: None ordered  Follow-Up: Your physician wants you to follow-up in: 6 months with Dr. Curt Bears.  You will receive a reminder letter in the mail two months in advance. If you don't receive a letter, please call our office to schedule the follow-up appointment.    Trinidad Curet, RN [phone]  Any Other Special Instructions Will Be Listed Below (If Applicable). Please call the office if you decide to proceed with any of the suggestions/information below.  Ask for Trinidad Curet, RN.  Flecainide tablets What is this medicine? FLECAINIDE (FLEK a nide) is an antiarrhythmic drug. This medicine is used to prevent irregular heart rhythm. It can also slow down fast heartbeats called tachycardia. This medicine may be used for other purposes; ask your health care provider or pharmacist if you have questions. COMMON BRAND NAME(S): Tambocor What should I tell my health care provider before I take this medicine? They need to know if you have any of these conditions:  abnormal levels of potassium in the blood  heart disease including heart rhythm and heart rate problems  kidney or liver disease  recent heart attack  an unusual or allergic reaction to flecainide, local anesthetics, other medicines, foods, dyes, or preservatives  pregnant or trying to get pregnant  breast-feeding How should I use this medicine? Take this medicine by mouth with a glass of water. Follow the directions on the prescription label. You can take this medicine with or without food. Take your doses at regular intervals. Do not take your medicine more often than directed. Do not stop taking  this medicine suddenly. This may cause serious, heart-related side effects. If your doctor wants you to stop the medicine, the dose may be slowly lowered over time to avoid any side effects. Talk to your pediatrician regarding the use of this medicine in children. While this drug may be prescribed for children as young as 1 year of age for selected conditions, precautions do apply. Overdosage: If you think you have taken too much of this medicine contact a poison control center or emergency room at once. NOTE: This medicine is only for you. Do not share this medicine with others. What if I miss a dose? If you miss a dose, take it as soon as you can. If it is almost time for your next dose, take only that dose. Do not take double or extra doses. What may interact with this medicine? Do not take this medicine with any of the following medications:  amoxapine  arsenic trioxide  certain antibiotics like clarithromycin, erythromycin, gatifloxacin, gemifloxacin, levofloxacin, moxifloxacin, sparfloxacin, or troleandomycin  certain antidepressants called tricyclic antidepressants like amitriptyline, imipramine, or nortriptyline  certain medicines to control heart rhythm like disopyramide, encainide, moricizine, procainamide, propafenone, and quinidine  cisapride  delavirdine  droperidol  haloperidol  hawthorn  imatinib  levomethadyl  maprotiline  medicines for malaria like chloroquine and halofantrine  pentamidine  phenothiazines like chlorpromazine, mesoridazine, prochlorperazine, thioridazine  pimozide  quinine  ranolazine  ritonavir  sertindole This medicine may also interact with the following medications:  cimetidine  dofetilide  medicines for angina or high blood pressure  medicines to control heart rhythm like amiodarone and digoxin  ziprasidone This list may  not describe all possible interactions. Give your health care provider a list of all the medicines,  herbs, non-prescription drugs, or dietary supplements you use. Also tell them if you smoke, drink alcohol, or use illegal drugs. Some items may interact with your medicine. What should I watch for while using this medicine? Visit your doctor or health care professional for regular checks on your progress. Because your condition and the use of this medicine carries some risk, it is a good idea to carry an identification card, necklace or bracelet with details of your condition, medications and doctor or health care professional. Check your blood pressure and pulse rate regularly. Ask your health care professional what your blood pressure and pulse rate should be, and when you should contact him or her. Your doctor or health care professional also may schedule regular blood tests and electrocardiograms to check your progress. You may get drowsy or dizzy. Do not drive, use machinery, or do anything that needs mental alertness until you know how this medicine affects you. Do not stand or sit up quickly, especially if you are an older patient. This reduces the risk of dizzy or fainting spells. Alcohol can make you more dizzy, increase flushing and rapid heartbeats. Avoid alcoholic drinks. What side effects may I notice from receiving this medicine? Side effects that you should report to your doctor or health care professional as soon as possible:  chest pain, continued irregular heartbeats  difficulty breathing  swelling of the legs or feet  trembling, shaking  unusually weak or tired Side effects that usually do not require medical attention (report to your doctor or health care professional if they continue or are bothersome):  blurred vision  constipation  headache  nausea, vomiting  stomach pain This list may not describe all possible side effects. Call your doctor for medical advice about side effects. You may report side effects to FDA at 1-800-FDA-1088. Where should I keep my medicine?  Keep out of the reach of children. Store at room temperature between 15 and 30 degrees C (59 and 86 degrees F). Protect from light. Keep container tightly closed. Throw away any unused medicine after the expiration date. NOTE: This sheet is a summary. It may not cover all possible information. If you have questions about this medicine, talk to your doctor, pharmacist, or health care provider.  2020 Elsevier/Gold Standard (2018-04-07 11:41:38)  Diltiazem tablets What is this medicine? DILTIAZEM (dil TYE a zem) is a calcium-channel blocker. It affects the amount of calcium found in your heart and muscle cells. This relaxes your blood vessels, which can reduce the amount of work the heart has to do. This medicine is used to treat chest pain caused by angina. This medicine may be used for other purposes; ask your health care provider or pharmacist if you have questions. COMMON BRAND NAME(S): Cardizem What should I tell my health care provider before I take this medicine? They need to know if you have any of these conditions:  heart problems, low blood pressure, irregular heartbeat  liver disease  previous heart attack  an unusual or allergic reaction to diltiazem, other medicines, foods, dyes, or preservatives  pregnant or trying to get pregnant  breast-feeding How should I use this medicine? Take this medicine by mouth with a glass of water. Follow the directions on the prescription label. Do not cut, crush or chew this medicine. This medicine is usually taken before meals and at bedtime. Take your doses at regular intervals. Do not take  your medicine more often then directed. Do not stop taking except on the advice of your doctor or health care professional. Talk to your pediatrician regarding the use of this medicine in children. Special care may be needed. Overdosage: If you think you have taken too much of this medicine contact a poison control center or emergency room at once. NOTE:  This medicine is only for you. Do not share this medicine with others. What if I miss a dose? If you miss a dose, take it as soon as you can. If it is almost time for your next dose, take only that dose. Do not take double or extra doses. What may interact with this medicine? Do not take this medicine with any of the following:  cisapride  hawthorn  pimozide  ranolazine  red yeast rice This medicine may also interact with the following medications:  buspirone  carbamazepine  cimetidine  cyclosporine  digoxin  local anesthetics or general anesthetics  lovastatin  medicines for anxiety or difficulty sleeping like midazolam and triazolam  medicines for high blood pressure or heart problems  quinidine  rifampin, rifabutin, or rifapentine This list may not describe all possible interactions. Give your health care provider a list of all the medicines, herbs, non-prescription drugs, or dietary supplements you use. Also tell them if you smoke, drink alcohol, or use illegal drugs. Some items may interact with your medicine. What should I watch for while using this medicine? Check your blood pressure and pulse rate regularly. Ask your doctor or health care professional what your blood pressure and pulse rate should be and when you should contact him or her. You may feel dizzy or lightheaded. Do not drive, use machinery, or do anything that needs mental alertness until you know how this medicine affects you. To reduce the risk of dizzy or fainting spells, do not sit or stand up quickly, especially if you are an older patient. Alcohol can make you more dizzy or increase flushing and rapid heartbeats. Avoid alcoholic drinks. What side effects may I notice from receiving this medicine? Side effects that you should report to your doctor or health care professional as soon as possible:  allergic reactions like skin rash, itching or hives, swelling of the face, lips, or tongue   confusion, mental depression  feeling faint or lightheaded, falls  pinpoint red spots on the skin  redness, blistering, peeling or loosening of the skin, including inside the mouth  slow, irregular heartbeat  swelling of the ankles, feet  unusual bleeding or bruising Side effects that usually do not require medical attention (report to your doctor or health care professional if they continue or are bothersome):  change in sex drive or performance  constipation or diarrhea  flushing of the face  headache  nausea, vomiting  tired or weak  trouble sleeping This list may not describe all possible side effects. Call your doctor for medical advice about side effects. You may report side effects to FDA at 1-800-FDA-1088. Where should I keep my medicine? Keep out of the reach of children. Store at room temperature between 20 and 25 degrees C (68 and 77 degrees F). Protect from light. Keep container tightly closed. Throw away any unused medicine after the expiration date. NOTE: This sheet is a summary. It may not cover all possible information. If you have questions about this medicine, talk to your doctor, pharmacist, or health care provider.  2020 Elsevier/Gold Standard (2013-03-30 10:54:31)    Electrical Cardioversion  Electrical cardioversion  is the delivery of a jolt of electricity to restore a normal rhythm to the heart. A rhythm that is too fast or is not regular keeps the heart from pumping well. In this procedure, sticky patches or metal paddles are placed on the chest to deliver electricity to the heart from a device. This procedure may be done in an emergency if:  There is low or no blood pressure as a result of the heart rhythm.  Normal rhythm must be restored as fast as possible to protect the brain and heart from further damage.  It may save a life. This procedure may also be done for irregular or fast heart rhythms that are not immediately life-threatening. Tell  a health care provider about:  Any allergies you have.  All medicines you are taking, including vitamins, herbs, eye drops, creams, and over-the-counter medicines.  Any problems you or family members have had with anesthetic medicines.  Any blood disorders you have.  Any surgeries you have had.  Any medical conditions you have.  Whether you are pregnant or may be pregnant. What are the risks? Generally, this is a safe procedure. However, problems may occur, including:  Allergic reactions to medicines.  A blood clot that breaks free and travels to other parts of your body.  The possible return of an abnormal heart rhythm within hours or days after the procedure.  Your heart stopping (cardiac arrest). This is rare. What happens before the procedure? Medicines  Your health care provider may have you start taking: ? Blood-thinning medicines (anticoagulants) so your blood does not clot as easily. ? Medicines may be given to help stabilize your heart rate and rhythm.  Ask your health care provider about changing or stopping your regular medicines. This is especially important if you are taking diabetes medicines or blood thinners. General instructions  Plan to have someone take you home from the hospital or clinic.  If you will be going home right after the procedure, plan to have someone with you for 24 hours.  Follow instructions from your health care provider about eating or drinking restrictions. What happens during the procedure?  To lower your risk of infection: ? Your health care team will wash or sanitize their hands. ? Your skin will be washed with soap.  An IV tube will be inserted into one of your veins.  You will be given a medicine to help you relax (sedative).  Sticky patches (electrodes) or metal paddles may be placed on your chest.  An electrical shock will be delivered. The procedure may vary among health care providers and hospitals. What happens  after the procedure?   Your blood pressure, heart rate, breathing rate, and blood oxygen level will be monitored until the medicines you were given have worn off.  Do not drive for 24 hours if you were given a sedative.  Your heart rhythm will be watched to make sure it does not change. This information is not intended to replace advice given to you by your health care provider. Make sure you discuss any questions you have with your health care provider. Document Released: 04/06/2002 Document Revised: 03/29/2017 Document Reviewed: 10/21/2015 Elsevier Patient Education  2020 [employer]

## 2019-01-12 DIAGNOSIS — H2513 Age-related nuclear cataract, bilateral: Secondary | ICD-10-CM | POA: Diagnosis not present

## 2019-01-12 DIAGNOSIS — H0102A Squamous blepharitis right eye, upper and lower eyelids: Secondary | ICD-10-CM | POA: Diagnosis not present

## 2019-01-12 DIAGNOSIS — H16223 Keratoconjunctivitis sicca, not specified as Sjogren's, bilateral: Secondary | ICD-10-CM | POA: Diagnosis not present

## 2019-01-12 DIAGNOSIS — H0102B Squamous blepharitis left eye, upper and lower eyelids: Secondary | ICD-10-CM | POA: Diagnosis not present

## 2019-02-19 ENCOUNTER — Other Ambulatory Visit: Payer: Self-pay

## 2019-02-19 ENCOUNTER — Ambulatory Visit: Payer: BC Managed Care – PPO | Admitting: Emergency Medicine

## 2019-02-19 ENCOUNTER — Encounter: Payer: Self-pay | Admitting: Emergency Medicine

## 2019-02-19 VITALS — BP 134/89 | HR 85 | Temp 98.1°F | Resp 16 | Ht 63.5 in | Wt 224.4 lb

## 2019-02-19 DIAGNOSIS — Z024 Encounter for examination for driving license: Secondary | ICD-10-CM

## 2019-02-19 NOTE — Progress Notes (Signed)
BP Readings from Last 3 Encounters:  12/16/18 132/82  11/11/18 134/76  06/13/18 124/77   Commercial Driver Medical Examination   William Dodson is a 68 y.o. male with a pertinent medical history of AFIB status post ablation and well controlled sleep apnea who presents today for a commercial driver fitness determination physical exam. The patient reports no problems today. He denies focal neurological deficits, vision and hearing changes. He denies the habitual use of benzodiazepines, opioids, amphetamines and denies illicit drug use.   Current medications, family history, allergies, social history reviewed by me and exist elsewhere in the encounter.       This patient presents for DOT examination for fitness for duty.   Medical History:  1. Head/Brain Injuries, disorders or illnesses no  2. Seizures, epilepsy no  3. Eye disorders or impaired vision (except corrective lenses) no  4. Ear disorders, loss of hearing or balance no  5. Heart disease or heart attack, other cardiovascular condition no  6. Heart surgery (valve replacement/bypass, angioplasty, pacemaker/defribrillator) no  7. High blood pressure no  8. High cholesterol no  9. Chronic cough, shortness of breath or other breathing problems no  10. Lung disease (emphysema, asthma or chronic bronchitis) no  11. Kidney disease, dialysis no  12. Digestive problems  no  13. Diabetes or elevated blood sugar no                      If yes to #13, Insulin use n/a  14. Nervious or psychiatric disorders, e.g., severe depression no  15. Fainting or syncope no  16. Dizziness, headaches, numbness, tingling or memory loss no  17. Unexplained weight loss no  18. Stroke, TIA or paralysis no  19. Missing or impaired hand, arm, foot, leg, finger, toe no  20. Spinal injury or disease no  21. Bone, muscles or nerve problems no  22. Blood clots or bleeding bleeding disorders no  23. Cancer no  24. Chronic infection or other chronic  diseases no  25. Sleep disorders, pauses in breathing while asleep, daytime sleepiness, loud snoring yes  26. Have you ever had a sleep test? yes  27.  Have you ever spent a night in the hospital? yes  28. Have you ever had a broken bone? no  29. Have you or or do you use tobacco products? no  30. Regular, frequent alcohol use yes  31. Illegal substance use within the past 2 years no  32.  Have you ever failed a drug test or been dependent on an illegal substance? no   Current Medications: Prior to Admission medications   Medication Sig Start Date End Date Taking? Authorizing Provider  b complex vitamins tablet Take 1 tablet by mouth daily.    Yes [provider]  BIOTIN PO Take 50 mcg by mouth daily.   Yes [provider]  Cholecalciferol (VITAMIN D3) 5000 units CAPS Take 5,000 Units by mouth daily.   Yes [provider]  CHOLINE PO Take 20 mg by mouth daily.   Yes [provider]  Chromium Picolinate (CHROMIUM PICOLATE PO) Take 200 mcg by mouth daily.    Yes [provider]  Folic Acid (FOLATE PO) Take 200 mcg by mouth daily.   Yes [provider]  ibuprofen (ADVIL,MOTRIN) 200 MG tablet Take 200-400 mg by mouth every 8 (eight) hours as needed for moderate pain.   Yes [provider]  INOSITOL PO Take 50 mg by mouth daily.  Yes [provider]  Javier Docker Oil 1000 MG CAPS Take 1,000 mg by mouth daily.    Yes [provider]  MAGNESIUM PO Take 500 mg by mouth daily.    Yes [provider]  Milk Thistle 175 MG CAPS Take 175 mg by mouth daily.    Yes [provider]  Misc Natural Products (SAW PALMETTO) CAPS Take 500 mg by mouth daily.    Yes [provider]  Niacin (VITAMIN B-3 PO) Take 50 mg by mouth daily.   Yes [provider]  P-Aminobenzoic Acid (PABA) 100 MG TABS Take 25 mg by mouth daily.   Yes [provider]  Pantothenic Acid 250 MG CAPS Take 125 mg by mouth  daily.   Yes [provider]  Riboflavin (VITAMIN B-2) 25 MG TABS Take 25 mg by mouth daily.   Yes [provider]  thiamine (VITAMIN B-1) 50 MG tablet Take 25 mg by mouth daily.   Yes [provider]  Ubiquinol 100 MG CAPS Take 100 mg by mouth daily.    Yes [provider]  vitamin A 10000 UNIT capsule Take 10,000 Units by mouth daily.    Yes [provider]  vitamin B-12 (CYANOCOBALAMIN) 250 MCG tablet Take 125 mcg by mouth daily.   Yes [provider]  vitamin B-6 (PYRIDOXINE) 25 MG tablet Take 25 mg by mouth daily.   Yes [provider]  vitamin C (ASCORBIC ACID) 500 MG tablet Take 500 mg by mouth daily.   Yes [provider]    Medical Examiner's Comments on Health History:  Stable and well-controlled chronic medical problems.  Excellent compliance with CPAP machine. Chronic atrial fibrillation with controlled ventricular response.  On no medications.  Recently seen and evaluated by his cardiologist.  Medical record reviewed.  TESTING:   Hearing Screening   125Hz  250Hz  500Hz  1000Hz  2000Hz  3000Hz  4000Hz  6000Hz  8000Hz   Right ear:           Left ear:           Comments: Whisper test:  R ear 10 ft                        L ear 10 ft   Visual Acuity Screening   Right eye Left eye Both eyes  Without correction:     With correction: 20/20 20-1 20/20  Comments: Colors: 6/6 Periph: R eye  85 degrees   L eye  85 degrees   Monocular Vision: No.  Hearing Aid used for test: No. Hearing Aid required to to meet standard: No.  BP 134/89   Pulse 85   Temp 98.1 F (36.7 C) (Oral)   Resp 16   Ht 5' 3.5" (1.613 m) Comment: PER PATIENT  Wt 224 lb 6.4 oz (101.8 kg)   SpO2 97%   BMI 39.13 kg/m  Pulse rate is irregular  Comments: U/A: protein-negative, blood-negative, spg-1.025, glucose-negative  PHYSICAL EXAMINATION:  General Appearance Not markedly obese. No tremor, signs of alcoholism, problem drinking or drug  abuse.   Skin Warm, dry and intact.  Eyes Pupils are equal, round and reactive to light and accommodation, extraocular movements are intact. No exophthalmos, no nystagmus.  Ears Normal external ears. External canal without occlusion. No scarring of the TM. No perforation of the TM.  Mouth and Throat Clear and moist. No irremedial deformities likely to interfere with breathing or swallowing.  Heart No murmurs, extra sounds, evidence of cardiomegaly.  No pacemaker. No implantable defibrillator.  Lungs and Chest (excluding breasts) Normal chest expansion, respiratory rate, breath sounds. No cyanosis.  Abdomen and Viscera No liver enlargement. No splenic enlargement. No masses, bruits, hernias or significant abdominal wall weakness.  Genitourinary  No inguinal or femoral hernia.  Spine and other musculoskeletal No tenderness, no limitation of motion, no deformities. No evidence of previous surgery.  Extremities No loss or impairment of leg, foot, toe, arm, hand, finger. No perceptible limp, deformities, atrophy, weakness, paralysis, clubbing, edema, hypotonia. Patient has sufficient grasp and prehension to maintain steering wheel grip. Patient has sufficient mobility and strength in the lower limbs to operate pedals properly.  Neurologic Normal equilibrium, coordination, speech pattern. No paresthesia, asymmetry of deep tendon reflexes, sensory or positional abnormalities. No abnormality of patellar or Babinski's reflexes.  Gait Not antalgic or ataxic  Vascular Normal pulses. No carotid or arterial bruits. No varicose veins.    Certification Status:  Meets standards, but periodic monitoring required due to: Chronic A. fib and OSA.  Driver qualified only for: 1 year     Certification expires 02/19/2020.

## 2019-02-19 NOTE — Patient Instructions (Addendum)
   If you have lab work done today you will be contacted with your lab results within the next 2 weeks.  If you have not heard from us then please contact us. The fastest way to get your results is to register for My Chart.   IF you received an x-ray today, you will receive an invoice from [facility]. Please contact [facility] at [phone] with questions or concerns regarding your invoice.   IF you received labwork today, you will receive an invoice from [facility]. Please contact [facility] at [phone] with questions or concerns regarding your invoice.   Our billing staff will not be able to assist you with questions regarding bills from these companies.  You will be contacted with the lab results as soon as they are available. The fastest way to get your results is to activate your My Chart account. Instructions are located on the last page of this paperwork. If you have not heard from us regarding the results in 2 weeks, please contact this office.     Health Maintenance After Age 65 After age 65, you are at a higher risk for certain long-term diseases and infections as well as injuries from falls. Falls are a major cause of broken bones and head injuries in people who are older than age 65. Getting regular preventive care can help to keep you healthy and well. Preventive care includes getting regular testing and making lifestyle changes as recommended by your health care provider. Talk with your health care provider about:  Which screenings and tests you should have. A screening is a test that checks for a disease when you have no symptoms.  A diet and exercise plan that is right for you. What should I know about screenings and tests to prevent falls? Screening and testing are the best ways to find a health problem early. Early diagnosis and treatment give you the best chance of managing medical conditions that are common after age 65. Certain conditions and  lifestyle choices may make you more likely to have a fall. Your health care provider may recommend:  Regular vision checks. Poor vision and conditions such as cataracts can make you more likely to have a fall. If you wear glasses, make sure to get your prescription updated if your vision changes.  Medicine review. Work with your health care provider to regularly review all of the medicines you are taking, including over-the-counter medicines. Ask your health care provider about any side effects that may make you more likely to have a fall. Tell your health care provider if any medicines that you take make you feel dizzy or sleepy.  Osteoporosis screening. Osteoporosis is a condition that causes the bones to get weaker. This can make the bones weak and cause them to break more easily.  Blood pressure screening. Blood pressure changes and medicines to control blood pressure can make you feel dizzy.  Strength and balance checks. Your health care provider may recommend certain tests to check your strength and balance while standing, walking, or changing positions.  Foot health exam. Foot pain and numbness, as well as not wearing proper footwear, can make you more likely to have a fall.  Depression screening. You may be more likely to have a fall if you have a fear of falling, feel emotionally low, or feel unable to do activities that you used to do.  Alcohol use screening. Using too much alcohol can affect your balance and may make you more likely to   have a fall. What actions can I take to lower my risk of falls? General instructions  Talk with your health care provider about your risks for falling. Tell your health care provider if: ? You fall. Be sure to tell your health care provider about all falls, even ones that seem minor. ? You feel dizzy, sleepy, or off-balance.  Take over-the-counter and prescription medicines only as told by your health care provider. These include any  supplements.  Eat a healthy diet and maintain a healthy weight. A healthy diet includes low-fat dairy products, low-fat (lean) meats, and fiber from whole grains, beans, and lots of fruits and vegetables. Home safety  Remove any tripping hazards, such as rugs, cords, and clutter.  Install safety equipment such as grab bars in bathrooms and safety rails on stairs.  Keep rooms and walkways well-lit. Activity   Follow a regular exercise program to stay fit. This will help you maintain your balance. Ask your health care provider what types of exercise are appropriate for you.  If you need a cane or walker, use it as recommended by your health care provider.  Wear supportive shoes that have nonskid soles. Lifestyle  Do not drink alcohol if your health care provider tells you not to drink.  If you drink alcohol, limit how much you have: ? 0-1 drink a day for women. ? 0-2 drinks a day for men.  Be aware of how much alcohol is in your drink. In the [place] one drink equals one typical bottle of beer (12 oz), one-half glass of wine (5 oz), or one shot of hard liquor (1 oz).  Do not use any products that contain nicotine or tobacco, such as cigarettes and e-cigarettes. If you need help quitting, ask your health care provider. Summary  Having a healthy lifestyle and getting preventive care can help to protect your health and wellness after age 65.  Screening and testing are the best way to find a health problem early and help you avoid having a fall. Early diagnosis and treatment give you the best chance for managing medical conditions that are more common for people who are older than age 65.  Falls are a major cause of broken bones and head injuries in people who are older than age 65. Take precautions to prevent a fall at home.  Work with your health care provider to learn what changes you can make to improve your health and wellness and to prevent falls. This information is not intended  to replace advice given to you by your health care provider. Make sure you discuss any questions you have with your health care provider. Document Released: 02/27/2017 Document Revised: 08/07/2018 Document Reviewed: 02/27/2017 Elsevier Patient Education  2020 Elsevier Inc.  

## 2019-05-13 ENCOUNTER — Encounter: Payer: Self-pay | Admitting: Emergency Medicine

## 2019-05-15 ENCOUNTER — Encounter: Payer: Self-pay | Admitting: Emergency Medicine

## 2019-05-19 ENCOUNTER — Telehealth: Payer: Self-pay | Admitting: *Deleted

## 2019-05-19 DIAGNOSIS — G4733 Obstructive sleep apnea (adult) (pediatric): Secondary | ICD-10-CM | POA: Diagnosis not present

## 2019-05-19 NOTE — Telephone Encounter (Signed)
On 05/18/2019, faxed signed order CPAP supplies. Confirmation page 5:43 pm.

## 2019-05-22 DIAGNOSIS — H1131 Conjunctival hemorrhage, right eye: Secondary | ICD-10-CM | POA: Diagnosis not present

## 2019-07-02 ENCOUNTER — Ambulatory Visit: Payer: Self-pay | Attending: Internal Medicine

## 2019-07-02 DIAGNOSIS — Z23 Encounter for immunization: Secondary | ICD-10-CM | POA: Insufficient documentation

## 2019-07-02 NOTE — Progress Notes (Signed)
   Covid-19 Vaccination Clinic  Name:  William Dodson    MRN: [id number] DOB: 05/16/1950  07/02/2019  William Dodson was observed post Covid-19 immunization for 30 minutes from pre-vaccination screening without incident. He was provided with Vaccine Information Sheet and instruction to access the V-Safe system.   William Dodson was instructed to call 911 with any severe reactions post vaccine: Marland Kitchen Difficulty breathing  . Swelling of face and throat  . A fast heartbeat  . A bad rash all over body  . Dizziness and weakness   Immunizations Administered    Name Date Dose VIS Date Route   Pfizer COVID-19 Vaccine 07/02/2019  8:40 AM 0.3 mL 04/10/2019 Intramuscular   Manufacturer: [employer], [employer]   Lot: [id number]   NDC: [address]-1

## 2019-07-28 ENCOUNTER — Ambulatory Visit: Payer: Self-pay | Attending: Internal Medicine

## 2019-07-28 DIAGNOSIS — Z23 Encounter for immunization: Secondary | ICD-10-CM

## 2019-07-28 NOTE — Progress Notes (Signed)
   Covid-19 Vaccination Clinic  Name:  William Dodson    MRN: [id number] DOB: [birth year]  07/28/2019  [name] was observed post Covid-19 immunization for 15 minutes without incident. He was provided with Vaccine Information Sheet and instruction to access the V-Safe system.   [name] was instructed to call 911 with any severe reactions post vaccine: Marland Kitchen Difficulty breathing  . Swelling of face and throat  . A fast heartbeat  . A bad rash all over body  . Dizziness and weakness   Immunizations Administered    Name Date Dose VIS Date Route   Pfizer COVID-19 Vaccine 07/28/2019  1:07 PM 0.3 mL 04/10/2019 Intramuscular   Manufacturer: [employer], [employer]   Lot: [id number]   NDC: [address]-6

## 2020-02-10 ENCOUNTER — Ambulatory Visit: Payer: BC Managed Care – PPO | Admitting: Emergency Medicine

## 2020-04-10 NOTE — Progress Notes (Signed)
Cardiology Office Note Date:  04/11/2020  Patient ID:  William Dodson, William Dodson [birth year], MRN [id number] PCP:  Patient, No Pcp Per  Cardiologist/Electrophysiologist: Dr. Elberta Fortis    Chief Complaint: annual visit  History of Present Illness: William Dodson is a 69 y.o. male with history of myocarditis associated with pneumonia 1993, OSA w/CPAP, AFib  He comes in today to be seen for Dr. Elberta Fortis, last seen by him Aug 2020, at that time noted more DOE/fatigue, increased rate with exertion, though slows quickly with rest, compliant with CPAP. Discussed increasingly symptomatic with his AF, recommended starting flecainide, and DCCV, not anticoagulated with low risk score., he did not think repeat ablation was an option with suspect non-pulmonary vein triggers. The pt wanted to avoid medicines, recommended 71mo visit.  I don't see he was started on meds, did not have f/u.  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   He denies any CP, no palpitations or overt cardiac awareness.  His AFib symptom is reduced exertional capacity, less energy over all. He has started doing [place] [employer] and is walking for exercise, will get winded with hills, but feels quite well with exercise, particularly invigorated afterwards. No dizziness, near syncope or syncope. He reports 100% compliant with his CPAP, sleeps very well and uninterrupted.  We discussed his PMHx and CHA2DS2Vasc score.  He has had no change in his medical history or any new diagnosis' His score remians one for age This patients CHA2DS2-VASc Score and unadjusted Ischemic Stroke Rate (% per year) is equal to 0.6 % stroke rate/year from a score of 1 Above score calculated as 1 point each if present [CHF, HTN, DM, Vascular=MI/PAD/Aortic Plaque, Age if 65-74, or Male] Above score calculated as 2 points each if present [Age > 75, or Stroke/TIA/TE]    AFib hx PVI ablation [facility] in 1997 by Dr. Delena Serve  Past Medical  History:  Diagnosis Date  . Atrial fibrillation (HCC)    persistent,  s/p PVI by Dr Delena Serve at [facility]  . Myalgia   . 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(HCC)    "heart infection" in setting of pneumonia 1993  . Sleep apnea    recent sleep study, results pending    Past Surgical History:  Procedure Laterality Date  . ATRIAL ABLATION SURGERY  1999   afib ablation 1990s at [facility] by Ronn Melena  . VENTRAL HERNIA REPAIR N/A 08/24/2016   Procedure: LAPAROSCOPIC VENTRAL HERNIA REPAIR WITH MESH;  Surgeon: Berna Bue, MD;  Location: [facility] OR;  Service: General;  Laterality: N/A;  Laparoscopic Repair of Umbilical and Supraumbilical Hernia with Mesh     Current Outpatient Medications  Medication Sig Dispense Refill  . b complex vitamins tablet Take 1 tablet by mouth daily.    Marland Kitchen BIOTIN PO Take 50 mcg by mouth daily.    . Cholecalciferol (VITAMIN D3) 5000 units CAPS Take 5,000 Units by mouth daily.    . CHOLINE PO Take 20 mg by mouth daily.    . Chromium Picolinate (CHROMIUM PICOLATE PO) Take 200 mcg by mouth daily.    . Folic Acid (FOLATE PO) Take 200 mcg by mouth daily.    Marland Kitchen ibuprofen (ADVIL,MOTRIN) 200 MG tablet Take 200-400 mg by mouth every 8 (eight) hours as needed for moderate pain.    . INOSITOL PO Take 50 mg by mouth daily.    Boris Lown Oil 1000 MG CAPS Take 1,000 mg by mouth daily.    Marland Kitchen MAGNESIUM PO  Take 500 mg by mouth daily.    . Milk Thistle 175 MG CAPS Take 175 mg by mouth daily.    . Misc Natural Products (SAW PALMETTO) CAPS Take 500 mg by mouth daily.    . Niacin (VITAMIN B-3 PO) Take 50 mg by mouth daily.    Marland Kitchen P-Aminobenzoic Acid (PABA) 100 MG TABS Take 25 mg by mouth daily.    . Pantothenic Acid 250 MG CAPS Take 125 mg by mouth daily.    . Riboflavin (VITAMIN B-2) 25 MG TABS Take 25 mg by mouth daily.    Marland Kitchen thiamine (VITAMIN B-1) 50 MG tablet Take 25 mg by mouth daily.    Marland Kitchen Ubiquinol 100 MG CAPS Take 100 mg by mouth daily.    . vitamin A 16109 UNIT capsule Take 10,000 Units by mouth daily.     . vitamin B-12 (CYANOCOBALAMIN) 250 MCG tablet Take 125 mcg by mouth daily.    . vitamin B-6 (PYRIDOXINE) 25 MG tablet Take 25 mg by mouth daily.    . vitamin C (ASCORBIC ACID) 500 MG tablet Take 500 mg by mouth daily.     No current facility-administered medications for this visit.    Allergies:   Morphine and related, Penicillins, Doxycycline, Hydrocodone, and Septra [bactrim]   Social History:  The patient  reports that he has never smoked. He has never used smokeless tobacco. He reports current alcohol use of about 6.0 standard drinks of alcohol per week. He reports that he does not use drugs.   Family History:  The patient's family history includes Cancer in his mother; Diabetes in his maternal grandmother and mother; Heart disease in his father and paternal grandmother; Hyperlipidemia in his father; Mental illness in his brother and mother.  ROS:  Please see the history of present illness.    All other systems are reviewed and otherwise negative.   PHYSICAL EXAM:  VS:  BP 120/84   Pulse 81   Ht 5' 3.5" (1.613 m)   Wt 224 lb 9.6 oz (101.9 kg)   SpO2 97%   BMI 39.16 kg/m  BMI: Body mass index is 39.16 kg/m. Well nourished, well developed, in no acute distress HEENT: normocephalic, atraumatic Neck: no JVD, carotid bruits or masses Cardiac:  irreg-irreg; no significant murmurs, no rubs, or gallops Lungs:  CTA b/l, no wheezing, rhonchi or rales Abd: soft, nontender, obese MS: no deformity or atrophy Ext: no edema Skin: warm and dry, no rash Neuro:  No gross deficits appreciated Psych: euthymic mood, full affect    EKG:  Done today and reviewed by myself shows  AFib 81bpm, no changes appreciated   05/02/2018: TTE Study Conclusions  - Left ventricle: The cavity size was normal. There was mild  concentric hypertrophy. Systolic function was normal. The  estimated ejection fraction was in the range of 50% to 55%. Wall  motion was normal; there were no regional wall  motion  abnormalities.  - Aortic valve: There was mild regurgitation. Regurgitation  pressure half-time: 514 ms.  - Mitral valve: Transvalvular velocity was within the normal range.  There was no evidence for stenosis. There was trivial  regurgitation.  - Left atrium: The atrium was mildly dilated.  - Right ventricle: Systolic function was normal.  - Atrial septum: No defect or patent foramen ovale was identified  by color flow Doppler.  - Tricuspid valve: There was mild regurgitation.  - Pulmonary arteries: Systolic pressure was within the normal  range. PA peak pressure: 25 mm  Hg (S).    Recent Labs: No results found for requested labs within last 8760 hours.  No results found for requested labs within last 8760 hours.   CrCl cannot be calculated (Patient's most recent lab result is older than the maximum 21 days allowed.).   Wt Readings from Last 3 Encounters:  04/11/20 224 lb 9.6 oz (101.9 kg)  02/19/19 224 lb 6.4 oz (101.8 kg)  12/16/18 222 lb (100.7 kg)     Other studies reviewed: Additional studies/records reviewed today include: summarized above  ASSESSMENT AND PLAN:  1. Longstanding persistent AFib     CHA2DS2Vasc is one for age, pt reports no new medical diagnosis/change to his PMHx      We discussed at length management strategies for Afib as well as his embolic/stroke risk. At this time, he remains most comfortable with his current, rate controlled strategy and no a/c  He is on numerous supplements and cautioned on over use and care with these. He has not had labs of any kind in a few years, will get CMET, lipids, CBC on a day he is fasting I encouraged continued exercise, need for weight loss, and continued excellent compliance with his CPAP.  Disposition: F/u with [place] in a year again, sooner if needed, or should he decided to want to purse rhythm control  Current medicines are reviewed at length with the patient today.  The patient did not have  any concerns regarding medicines.  Norma Fredrickson, PA-C 04/11/2020 1:56 PM     [facility] [address] [place] [address] [phone] (office)  [phone] (fax)

## 2020-04-11 ENCOUNTER — Other Ambulatory Visit: Payer: Self-pay

## 2020-04-11 ENCOUNTER — Encounter: Payer: Self-pay | Admitting: Physician Assistant

## 2020-04-11 ENCOUNTER — Ambulatory Visit (INDEPENDENT_AMBULATORY_CARE_PROVIDER_SITE_OTHER): Payer: Medicare Other | Admitting: Physician Assistant

## 2020-04-11 VITALS — BP 120/84 | HR 81 | Ht 63.5 in | Wt 224.6 lb

## 2020-04-11 DIAGNOSIS — I4811 Longstanding persistent atrial fibrillation: Secondary | ICD-10-CM | POA: Diagnosis not present

## 2020-04-11 NOTE — Patient Instructions (Signed)
Medication Instructions:  Your physician recommends that you continue on your current medications as directed. Please refer to the Current Medication list given to you today.  *If you need a refill on your cardiac medications before your next appointment, please call your pharmacy*   Lab Work: RETURN FASTING ASAP FOR LIVER AND LIPID   If you have labs (blood work) drawn today and your tests are completely normal, you will receive your results only by: Marland Kitchen MyChart Message (if you have MyChart) OR . A paper copy in the mail If you have any lab test that is abnormal or we need to change your treatment, we will call you to review the results.   Testing/Procedures: NONE ORDERED  TODAY   Follow-Up: At [facility], you and your health needs are our priority.  As part of our continuing mission to provide you with exceptional heart care, we have created designated Provider Care Teams.  These Care Teams include your primary Cardiologist (physician) and Advanced Practice Providers (APPs -  Physician Assistants and Nurse Practitioners) who all work together to provide you with the care you need, when you need it.  We recommend signing up for the patient portal called "MyChart".  Sign up information is provided on this After Visit Summary.  MyChart is used to connect with patients for Virtual Visits (Telemedicine).  Patients are able to view lab/test results, encounter notes, upcoming appointments, etc.  Non-urgent messages can be sent to your provider as well.   To learn more about what you can do with MyChart, go to [email or web address].    Your next appointment:   1 year(s)  The format for your next appointment:   In Person  Provider:   Loman Brooklyn, MD   Other Instructions

## 2020-04-12 ENCOUNTER — Other Ambulatory Visit: Payer: Medicare Other | Admitting: *Deleted

## 2020-04-12 DIAGNOSIS — I4811 Longstanding persistent atrial fibrillation: Secondary | ICD-10-CM

## 2020-04-12 LAB — HEPATIC FUNCTION PANEL
ALT: 33 IU/L (ref 0–44)
AST: 29 IU/L (ref 0–40)
Albumin: 4.2 g/dL (ref 3.8–4.8)
Alkaline Phosphatase: 70 IU/L (ref 44–121)
Bilirubin Total: 0.6 mg/dL (ref 0.0–1.2)
Bilirubin, Direct: 0.17 mg/dL (ref 0.00–0.40)
Total Protein: 7 g/dL (ref 6.0–8.5)

## 2020-04-12 LAB — LIPID PANEL
Chol/HDL Ratio: 3.9 ratio (ref 0.0–5.0)
Cholesterol, Total: 186 mg/dL (ref 100–199)
HDL: 48 mg/dL (ref >39)
LDL Chol Calc (NIH): 111 mg/dL — ABNORMAL HIGH (ref 0–99)
Triglycerides: 153 mg/dL — ABNORMAL HIGH (ref 0–149)
VLDL Cholesterol Cal: 27 mg/dL (ref 5–40)

## 2020-04-13 ENCOUNTER — Other Ambulatory Visit: Payer: Medicare Other | Admitting: *Deleted

## 2020-04-13 ENCOUNTER — Other Ambulatory Visit: Payer: Self-pay | Admitting: *Deleted

## 2020-04-13 ENCOUNTER — Other Ambulatory Visit: Payer: Self-pay

## 2020-04-13 DIAGNOSIS — Z79899 Other long term (current) drug therapy: Secondary | ICD-10-CM

## 2020-04-14 LAB — COMPREHENSIVE METABOLIC PANEL
ALT: 33 IU/L (ref 0–44)
AST: 62 IU/L — ABNORMAL HIGH (ref 0–40)
Albumin/Globulin Ratio: 1.3 (ref 1.2–2.2)
Albumin: 4 g/dL (ref 3.8–4.8)
Alkaline Phosphatase: 67 IU/L (ref 44–121)
BUN/Creatinine Ratio: 16 (ref 10–24)
BUN: 16 mg/dL (ref 8–27)
Bilirubin Total: 0.6 mg/dL (ref 0.0–1.2)
CO2: 20 mmol/L (ref 20–29)
Calcium: 9.5 mg/dL (ref 8.6–10.2)
Chloride: 103 mmol/L (ref 96–106)
Creatinine, Ser: 0.98 mg/dL (ref 0.76–1.27)
GFR calc Af Amer: 90 mL/min/{1.73_m2} (ref >59)
GFR calc non Af Amer: 78 mL/min/{1.73_m2} (ref >59)
Globulin, Total: 3.1 g/dL (ref 1.5–4.5)
Glucose: 121 mg/dL — ABNORMAL HIGH (ref 65–99)
Sodium: 140 mmol/L (ref 134–144)
Total Protein: 7.1 g/dL (ref 6.0–8.5)

## 2020-04-14 LAB — CBC
Hematocrit: 45.9 % (ref 37.5–51.0)
Hemoglobin: 16 g/dL (ref 13.0–17.7)
MCH: 31.5 pg (ref 26.6–33.0)
MCHC: 34.9 g/dL (ref 31.5–35.7)
MCV: 90 fL (ref 79–97)
Platelets: 201 10*3/uL (ref 150–450)
RBC: 5.08 x10E6/uL (ref 4.14–5.80)
RDW: 13 % (ref 11.6–15.4)
WBC: 5.9 10*3/uL (ref 3.4–10.8)

## 2020-05-03 ENCOUNTER — Ambulatory Visit: Payer: BC Managed Care – PPO | Admitting: Emergency Medicine

## 2020-07-06 ENCOUNTER — Encounter: Payer: BC Managed Care – PPO | Admitting: Emergency Medicine

## 2020-07-18 ENCOUNTER — Telehealth: Payer: Self-pay | Admitting: *Deleted

## 2020-07-18 DIAGNOSIS — R9389 Abnormal findings on diagnostic imaging of other specified body structures: Secondary | ICD-10-CM

## 2020-07-18 NOTE — Telephone Encounter (Signed)
Received a note from patient's periodontist,  in preparation for dental implant surgery xray showed "appears to be some radiopacities consistent with carotid artery calcification" and please provide eval/treatment.  Spoke to pt and made aware Dr. Elberta Fortis recommends a carotid study. Aware office will be in touch to arrange testing. Informed periodontist office of plan. Patient verbalized understanding and agreeable to plan.

## 2020-07-22 ENCOUNTER — Other Ambulatory Visit: Payer: Self-pay | Admitting: Cardiology

## 2020-07-22 DIAGNOSIS — I6529 Occlusion and stenosis of unspecified carotid artery: Secondary | ICD-10-CM

## 2020-07-22 DIAGNOSIS — R9389 Abnormal findings on diagnostic imaging of other specified body structures: Secondary | ICD-10-CM

## 2020-07-27 ENCOUNTER — Other Ambulatory Visit: Payer: Self-pay

## 2020-07-27 ENCOUNTER — Ambulatory Visit (HOSPITAL_COMMUNITY)
Admission: RE | Admit: 2020-07-27 | Discharge: 2020-07-27 | Disposition: A | Discharge status: Home / Self Care | Payer: Medicare PPO | Source: Ambulatory Visit | Attending: Cardiovascular Disease | Admitting: Cardiovascular Disease

## 2020-07-27 DIAGNOSIS — I6529 Occlusion and stenosis of unspecified carotid artery: Secondary | ICD-10-CM | POA: Diagnosis present

## 2020-07-27 DIAGNOSIS — R9389 Abnormal findings on diagnostic imaging of other specified body structures: Secondary | ICD-10-CM | POA: Insufficient documentation

## 2020-07-27 DIAGNOSIS — I6523 Occlusion and stenosis of bilateral carotid arteries: Secondary | ICD-10-CM

## 2020-08-24 NOTE — Telephone Encounter (Signed)
[facility] notified of carotid testing result. Copy faxed to their office. Fax confirmation received.

## 2021-01-16 ENCOUNTER — Encounter: Payer: Self-pay | Admitting: Emergency Medicine

## 2021-01-17 NOTE — Telephone Encounter (Signed)
Called and scheduled pt for OV 01/19/21.

## 2021-01-19 ENCOUNTER — Ambulatory Visit (INDEPENDENT_AMBULATORY_CARE_PROVIDER_SITE_OTHER): Payer: Medicare PPO

## 2021-01-19 ENCOUNTER — Encounter: Payer: Self-pay | Admitting: Emergency Medicine

## 2021-01-19 ENCOUNTER — Other Ambulatory Visit: Payer: Self-pay

## 2021-01-19 ENCOUNTER — Ambulatory Visit: Payer: Medicare PPO | Admitting: Emergency Medicine

## 2021-01-19 VITALS — BP 124/70 | HR 86 | Ht 63.0 in | Wt 217.0 lb

## 2021-01-19 DIAGNOSIS — I482 Chronic atrial fibrillation, unspecified: Secondary | ICD-10-CM

## 2021-01-19 DIAGNOSIS — Z8701 Personal history of pneumonia (recurrent): Secondary | ICD-10-CM

## 2021-01-19 DIAGNOSIS — J189 Pneumonia, unspecified organism: Secondary | ICD-10-CM | POA: Insufficient documentation

## 2021-01-19 MED ORDER — AZITHROMYCIN 250 MG PO TABS: ORAL_TABLET | ORAL | 0 refills | Status: DC

## 2021-01-19 NOTE — Assessment & Plan Note (Signed)
Improving.  About 80% better.  Continue with Z-Pak. Chest x-ray better. Follow-up in 1 to 2 weeks as needed.

## 2021-01-19 NOTE — Patient Instructions (Signed)
Community-Acquired Pneumonia, Adult °Pneumonia is an infection of the lungs. It causes irritation and swelling in the airways of the lungs. Mucus and fluid may also build up inside the airways. This may cause coughing and trouble breathing. °One type of pneumonia can happen while you are in a hospital. A different type can happen when you are not in a hospital (community-acquired pneumonia). °What are the causes? °This condition is caused by germs (viruses, bacteria, or fungi). Some types of germs can spread from person to person. Pneumonia is not thought to spread from person to person. °What increases the risk? °You are more likely to develop this condition if: °You have a long-term (chronic) disease, such as: °Disease of the lungs. This may be chronic obstructive pulmonary disease (COPD) or asthma. °Heart failure. °Cystic fibrosis. °Diabetes. °Kidney disease. °Sickle cell disease. °HIV. °You have other health problems, such as: °Your body's defense system (immune system) is weak. °A condition that may cause you to breathe in fluids from your mouth and nose. °You had your spleen taken out. °You do not take good care of your teeth and mouth (poor dental hygiene). °You use or have used tobacco products. °You travel where the germs that cause this illness are common. °You are near certain animals or the places they live. °You are older than 71 years of age. °What are the signs or symptoms? °Symptoms of this condition include: °A cough. °A fever. °Sweating or chills. °Chest pain, often when you breathe deeply or cough. °Breathing problems, such as: °Fast breathing. °Trouble breathing. °Shortness of breath. °Feeling tired (fatigued). °Muscle aches. °How is this treated? °Treatment for this condition depends on many things, such as: °The cause of your illness. °Your medicines. °Your other health problems. °Most adults can be treated at home. Sometimes, treatment must happen in a hospital. °Treatment may include  medicines to kill germs. °Medicines may depend on which germ caused your illness. °Very bad pneumonia is rare. If you get it, you may: °Have a machine to help you breathe. °Have fluid taken away from around your lungs. °Follow these instructions at home: °Medicines °Take over-the-counter and prescription medicines only as told by your doctor. °Take cough medicine only if you are losing sleep. Cough medicine can keep your body from taking mucus away from your lungs. °If you were prescribed an antibiotic medicine, take it as told by your doctor. Do not stop taking the antibiotic even if you start to feel better. °Lifestyle °  °Do not drink alcohol. °Do not use any products that contain nicotine or tobacco, such as cigarettes, e-cigarettes, and chewing tobacco. If you need help quitting, ask your doctor. °Eat a healthy diet. This includes a lot of vegetables, fruits, whole grains, low-fat dairy products, and low-fat (lean) protein. °General instructions ° °Rest a lot. Sleep for at least 8 hours each night. °Sleep with your head and neck raised. Put a few pillows under your head or sleep in a reclining chair. °Return to your normal activities as told by your doctor. Ask your doctor what activities are safe for you. °Drink enough fluid to keep your pee (urine) pale yellow. °If your throat is sore, rinse your mouth often with salt water. To make salt water, dissolve ½-1 tsp (3-6 g) of salt in 1 cup (237 mL) of warm water. °Keep all follow-up visits as told by your doctor. This is important. °How is this prevented? °You can lower your risk of pneumonia by: °Getting the pneumonia shot (vaccine). These shots have different   types and schedules. Ask your doctor what works best for you. Think about getting this shot if: °You are older than 71 years of age. °You are 19-65 years of age and: °You are being treated for cancer. °You have long-term lung disease. °You have other problems that affect your body's defense system. Ask  your doctor if you have one of these. °Getting your flu shot every year. Ask your doctor which type of shot is best for you. °Going to the dentist as often as told. °Washing your hands often with soap and water for at least 20 seconds. If you cannot use soap and water, use hand sanitizer. °Contact a doctor if: °You have a fever. °You lose sleep because your cough medicine does not help. °Get help right away if: °You are short of breath and this gets worse. °You have more chest pain. °Your sickness gets worse. This is very serious if: °You are an older adult. °Your body's defense system is weak. °You cough up blood. °These symptoms may be an emergency. Do not wait to see if the symptoms will go away. Get medical help right away. Call your local emergency services (911 in the [place] Do not drive yourself to the hospital. °Summary °Pneumonia is an infection of the lungs. °Community-acquired pneumonia affects people who have not been in the hospital. Certain germs can cause this infection. °This condition may be treated with medicines that kill germs. °For very bad pneumonia, you may need a hospital stay and treatment to help with breathing. °This information is not intended to replace advice given to you by your health care provider. Make sure you discuss any questions you have with your health care provider. °Document Revised: 01/27/2019 Document Reviewed: 01/27/2019 °Elsevier Patient Education © 2022 Elsevier Inc. ° °

## 2021-01-19 NOTE — Progress Notes (Signed)
William Dodson 70 y.o.   Chief Complaint  Patient presents with   Pneumonia    Pt states he feels better    HISTORY OF PRESENT ILLNESS: This is a 70 y.o. male was in [place] 01/07/2021 when he developed acute symptoms of pneumonia. Went to urgent care center, had x-ray done, showed right lower lobe pneumonia.  Was started on doxycycline on 2 days later was started on cefpodoxime.  Here for follow-up today.  Feels about 80% better.  Pneumonia There is no cough or shortness of breath. Pertinent negatives include no chest pain, fever, headaches or sore throat.    Prior to Admission medications   Medication Sig Start Date End Date Taking? Authorizing Provider  b complex vitamins tablet Take 1 tablet by mouth daily.   Yes [provider]  BIOTIN PO Take 50 mcg by mouth daily.   Yes [provider]  Cholecalciferol (VITAMIN D3) 5000 units CAPS Take 5,000 Units by mouth daily.   Yes [provider]  CHOLINE PO Take 20 mg by mouth daily.   Yes [provider]  Chromium Picolinate (CHROMIUM PICOLATE PO) Take 200 mcg by mouth daily.   Yes [provider]  Folic Acid (FOLATE PO) Take 200 mcg by mouth daily.   Yes [provider]  ibuprofen (ADVIL,MOTRIN) 200 MG tablet Take 200-400 mg by mouth every 8 (eight) hours as needed for moderate pain.   Yes [provider]  INOSITOL PO Take 50 mg by mouth daily.   Yes [provider]  Boris [place]own Oil 1000 MG CAPS Take 1,000 mg by mouth daily.   Yes [provider]  MAGNESIUM PO Take 500 mg by mouth daily.   Yes [provider]  Milk Thistle 175 MG CAPS Take 175 mg by mouth daily.   Yes [provider]  Misc Natural Products (SAW PALMETTO) CAPS Take 500 mg by mouth daily.   Yes [provider]  Niacin (VITAMIN B-3 PO) Take 50 mg by mouth daily.   Yes [provider]  P-Aminobenzoic Acid (PABA) 100 MG TABS Take 25 mg by mouth daily.    Yes [provider]  Pantothenic Acid 250 MG CAPS Take 125 mg by mouth daily.   Yes [provider]  Riboflavin (VITAMIN B-2) 25 MG TABS Take 25 mg by mouth daily.   Yes [provider]  thiamine (VITAMIN B-1) 50 MG tablet Take 25 mg by mouth daily.   Yes [provider]  Ubiquinol 100 MG CAPS Take 100 mg by mouth daily.   Yes [provider]  vitamin A 40981 UNIT capsule Take 10,000 Units by mouth daily.   Yes [provider]  vitamin B-12 (CYANOCOBALAMIN) 250 MCG tablet Take 125 mcg by mouth daily.   Yes [provider]  vitamin B-6 (PYRIDOXINE) 25 MG tablet Take 25 mg by mouth daily.   Yes [provider]  vitamin C (ASCORBIC ACID) 500 MG tablet Take 500 mg by mouth daily.   Yes [provider]    Allergies  Allergen Reactions   Morphine And Related Other (See Comments)    Increased hearing, hallucinations, weird feeling   Penicillins    Doxycycline Itching and Rash        Hydrocodone Itching and Rash   Septra [Bactrim] Itching and Rash         Patient Active Problem List   Diagnosis Date Noted   Obstructive sleep apnea 06/28/2011   Sleep apnea  01/29/2011   Chronic systolic dysfunction of left ventricle 01/29/2011   ONYCHOMYCOSIS 06/27/2006   HYPERLIPIDEMIA 06/27/2006   ATRIAL FIBRILLATION 06/27/2006    Past Medical History:  Diagnosis Date   Atrial fibrillation (HCC)    persistent,  s/p PVI by Dr Delena Serve at [facility]   Myalgia    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[facility])    "heart infection" in setting of pneumonia 1993   Sleep apnea    recent sleep study, results pending    Past Surgical History:  Procedure Laterality Date   ATRIAL ABLATION SURGERY  1999   afib ablation 1990s at [facility] by Ronn Melena   VENTRAL HERNIA REPAIR N/A 08/24/2016   Procedure: LAPAROSCOPIC VENTRAL HERNIA REPAIR WITH MESH;  Surgeon: Berna Bue, MD;  Location: [facility] OR;  Service: General;  Laterality: N/A;  Laparoscopic Repair  of Umbilical and Supraumbilical Hernia with Mesh     Social History   Socioeconomic History   Marital status: Married    Spouse name: Not on file   Number of children: Not on file   Years of education: Not on file   Highest education level: Not on file  Occupational History   Occupation: Sculptor  Tobacco Use   Smoking status: Never   Smokeless tobacco: Never   Tobacco comments:    tried smoking 30 years ago  Vaping Use   Vaping Use: Never used  Substance and Sexual Activity   Alcohol use: Yes    Alcohol/week: 6.0 standard drinks    Types: 6 Glasses of wine per week    Comment: 1 glass of wine at night   Drug use: No   Sexual activity: Not on file  Other Topics Concern   Not on file  Social History Narrative   Married   Previous Programmer, systems [employer]   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, primarily with iron   Social Determinants of Health   Financial Resource Strain: Not on file  Food Insecurity: Not on file  Transportation Needs: Not on file  Physical Activity: Not on file  Stress: Not on file  Social Connections: Not on file  Intimate Partner Violence: Not on file    Family History  Problem Relation Age of Onset   Diabetes Mother    Cancer Mother    Mental illness Mother    Heart disease Father    Hyperlipidemia Father    Mental illness Brother    Diabetes Maternal Grandmother    Heart disease Paternal Grandmother      Review of Systems  Constitutional: Negative.  Negative for chills and fever.  HENT: Negative.  Negative for congestion and sore throat.   Respiratory: Negative.  Negative for cough and shortness of breath.   Cardiovascular: Negative.  Negative for chest pain and palpitations.  Gastrointestinal:  Negative for abdominal pain, diarrhea, nausea and vomiting.  Genitourinary: Negative.  Negative for dysuria and hematuria.  Skin: Negative.  Negative for rash.  Neurological: Negative.  Negative for dizziness and headaches.   All other systems reviewed and are negative.   Physical Exam Vitals reviewed.  Constitutional:      Appearance: Normal appearance.  HENT:     Head: Normocephalic.  Eyes:     Extraocular Movements: Extraocular movements intact.     Pupils: Pupils are equal, round, and reactive to light.  Cardiovascular:     Rate and Rhythm: Normal rate. Rhythm irregular.     Pulses: Normal pulses.  Heart sounds: Normal heart sounds.  Pulmonary:     Effort: Pulmonary effort is normal.     Breath sounds: Rales (Dry crackles right lower lobe) present.  Musculoskeletal:     Cervical back: Normal range of motion and neck supple.  Skin:    General: Skin is warm and dry.     Capillary Refill: Capillary refill takes less than 2 seconds.  Neurological:     General: No focal deficit present.     Mental Status: He is alert and oriented to person, place, and time.  Psychiatric:        Mood and Affect: Mood normal.        Behavior: Behavior normal.    DG Chest 2 View  Result Date: 01/19/2021 CLINICAL DATA:  Pneumonia. EXAM: CHEST - 2 VIEW COMPARISON:  June 13, 2018. FINDINGS: The heart size and mediastinal contours are within normal limits. Both lungs are clear. The visualized skeletal structures are unremarkable. IMPRESSION: No active cardiopulmonary disease. Electronically Signed   By: Lupita Raider M.D.   On: 01/19/2021 15:57    ASSESSMENT & PLAN: Community acquired pneumonia of right lower lobe of lung Improving.  About 80% better.  Continue with Z-Pak. Chest x-ray better. Follow-up in 1 to 2 weeks as needed. Mister was seen today for pneumonia.  Diagnoses and all orders for this visit:  Community acquired pneumonia of right lower lobe of lung Comments: Improving Orders: -     azithromycin (ZITHROMAX) 250 MG tablet; Sig as indicated  History of recent pneumonia -     DG Chest 2 View  Chronic atrial fibrillation [facility])  Patient Instructions  Community-Acquired Pneumonia,  Adult Pneumonia is an infection of the lungs. It causes irritation and swelling in the airways of the lungs. Mucus and fluid may also build up inside the airways. This may cause coughing and trouble breathing. One type of pneumonia can happen while you are in a hospital. A different type can happen when you are not in a hospital (community-acquired pneumonia). What are the causes? This condition is caused by germs (viruses, bacteria, or fungi). Some types of germs can spread from person to person. Pneumonia is not thought to spread from person to person. What increases the risk? You are more likely to develop this condition if: You have a long-term (chronic) disease, such as: Disease of the lungs. This may be chronic obstructive pulmonary disease (COPD) or asthma. Heart failure. Cystic fibrosis. Diabetes. Kidney disease. Sickle cell disease. HIV. You have other health problems, such as: Your body's defense system (immune system) is weak. A condition that may cause you to breathe in fluids from your mouth and nose. You had your spleen taken out. You do not take good care of your teeth and mouth (poor dental hygiene). You use or have used tobacco products. You travel where the germs that cause this illness are common. You are near certain animals or the places they live. You are older than 71 years of age. What are the signs or symptoms? Symptoms of this condition include: A cough. A fever. Sweating or chills. Chest pain, often when you breathe deeply or cough. Breathing problems, such as: Fast breathing. Trouble breathing. Shortness of breath. Feeling tired (fatigued). Muscle aches. How is this treated? Treatment for this condition depends on many things, such as: The cause of your illness. Your medicines. Your other health problems. Most adults can be treated at home. Sometimes, treatment must happen in a hospital. Treatment may include medicines  to kill germs. Medicines  may depend on which germ caused your illness. Very bad pneumonia is rare. If you get it, you may: Have a machine to help you breathe. Have fluid taken away from around your lungs. Follow these instructions at home: Medicines Take over-the-counter and prescription medicines only as told by your doctor. Take cough medicine only if you are losing sleep. Cough medicine can keep your body from taking mucus away from your lungs. If you were prescribed an antibiotic medicine, take it as told by your doctor. Do not stop taking the antibiotic even if you start to feel better. Lifestyle   Do not drink alcohol. Do not use any products that contain nicotine or tobacco, such as cigarettes, e-cigarettes, and chewing tobacco. If you need help quitting, ask your doctor. Eat a healthy diet. This includes a lot of vegetables, fruits, whole grains, low-fat dairy products, and low-fat (lean) protein. General instructions  Rest a lot. Sleep for at least 8 hours each night. Sleep with your head and neck raised. Put a few pillows under your head or sleep in a reclining chair. Return to your normal activities as told by your doctor. Ask your doctor what activities are safe for you. Drink enough fluid to keep your pee (urine) pale yellow. If your throat is sore, rinse your mouth often with salt water. To make salt water, dissolve -1 tsp (3-6 g) of salt in 1 cup (237 mL) of warm water. Keep all follow-up visits as told by your doctor. This is important. How is this prevented? You can lower your risk of pneumonia by: Getting the pneumonia shot (vaccine). These shots have different types and schedules. Ask your doctor what works best for you. Think about getting this shot if: You are older than 71 years of age. You are 45-69 years of age and: You are being treated for cancer. You have long-term lung disease. You have other problems that affect your body's defense system. Ask your doctor if you have one of  these. Getting your flu shot every year. Ask your doctor which type of shot is best for you. Going to the dentist as often as told. Washing your hands often with soap and water for at least 20 seconds. If you cannot use soap and water, use hand sanitizer. Contact a doctor if: You have a fever. You lose sleep because your cough medicine does not help. Get help right away if: You are short of breath and this gets worse. You have more chest pain. Your sickness gets worse. This is very serious if: You are an older adult. Your body's defense system is weak. You cough up blood. These symptoms may be an emergency. Do not wait to see if the symptoms will go away. Get medical help right away. Call your local emergency services (911 in the [place] Do not drive yourself to the hospital. Summary Pneumonia is an infection of the lungs. Community-acquired pneumonia affects people who have not been in the hospital. Certain germs can cause this infection. This condition may be treated with medicines that kill germs. For very bad pneumonia, you may need a hospital stay and treatment to help with breathing. This information is not intended to replace advice given to you by your health care provider. Make sure you discuss any questions you have with your health care provider. Document Revised: 01/27/2019 Document Reviewed: 01/27/2019 Elsevier Patient Education  2022 Elsevier Inc.    Edwina Barth, MD [facility]

## 2021-01-26 ENCOUNTER — Ambulatory Visit (INDEPENDENT_AMBULATORY_CARE_PROVIDER_SITE_OTHER): Payer: Medicare PPO | Admitting: Emergency Medicine

## 2021-01-26 ENCOUNTER — Encounter: Payer: Self-pay | Admitting: Emergency Medicine

## 2021-01-26 ENCOUNTER — Telehealth: Payer: Medicare PPO | Admitting: Emergency Medicine

## 2021-01-26 ENCOUNTER — Other Ambulatory Visit: Payer: Self-pay

## 2021-01-26 VITALS — BP 124/76 | HR 77 | Temp 98.1°F | Ht 63.0 in | Wt 220.0 lb

## 2021-01-26 DIAGNOSIS — J189 Pneumonia, unspecified organism: Secondary | ICD-10-CM | POA: Diagnosis not present

## 2021-01-26 DIAGNOSIS — I482 Chronic atrial fibrillation, unspecified: Secondary | ICD-10-CM

## 2021-01-26 NOTE — Patient Instructions (Signed)
Health Maintenance After Age 71 After age 71, you are at a higher risk for certain long-term diseases and infections as well as injuries from falls. Falls are a major cause of broken bones and head injuries in people who are older than age 71. Getting regular preventive care can help to keep you healthy and well. Preventive care includes getting regular testing and making lifestyle changes as recommended by your health care provider. Talk with your health care provider about: Which screenings and tests you should have. A screening is a test that checks for a disease when you have no symptoms. A diet and exercise plan that is right for you. What should I know about screenings and tests to prevent falls? Screening and testing are the best ways to find a health problem early. Early diagnosis and treatment give you the best chance of managing medical conditions that are common after age 71. Certain conditions and lifestyle choices may make you more likely to have a fall. Your health care provider may recommend: Regular vision checks. Poor vision and conditions such as cataracts can make you more likely to have a fall. If you wear glasses, make sure to get your prescription updated if your vision changes. Medicine review. Work with your health care provider to regularly review all of the medicines you are taking, including over-the-counter medicines. Ask your health care provider about any side effects that may make you more likely to have a fall. Tell your health care provider if any medicines that you take make you feel dizzy or sleepy. Osteoporosis screening. Osteoporosis is a condition that causes the bones to get weaker. This can make the bones weak and cause them to break more easily. Blood pressure screening. Blood pressure changes and medicines to control blood pressure can make you feel dizzy. Strength and balance checks. Your health care provider may recommend certain tests to check your strength and  balance while standing, walking, or changing positions. Foot health exam. Foot pain and numbness, as well as not wearing proper footwear, can make you more likely to have a fall. Depression screening. You may be more likely to have a fall if you have a fear of falling, feel emotionally low, or feel unable to do activities that you used to do. Alcohol use screening. Using too much alcohol can affect your balance and may make you more likely to have a fall. What actions can I take to lower my risk of falls? General instructions Talk with your health care provider about your risks for falling. Tell your health care provider if: You fall. Be sure to tell your health care provider about all falls, even ones that seem minor. You feel dizzy, sleepy, or off-balance. Take over-the-counter and prescription medicines only as told by your health care provider. These include any supplements. Eat a healthy diet and maintain a healthy weight. A healthy diet includes low-fat dairy products, low-fat (lean) meats, and fiber from whole grains, beans, and lots of fruits and vegetables. Home safety Remove any tripping hazards, such as rugs, cords, and clutter. Install safety equipment such as grab bars in bathrooms and safety rails on stairs. Keep rooms and walkways well-lit. Activity  Follow a regular exercise program to stay fit. This will help you maintain your balance. Ask your health care provider what types of exercise are appropriate for you. If you need a cane or walker, use it as recommended by your health care provider. Wear supportive shoes that have nonskid soles. Lifestyle Do not   drink alcohol if your health care provider tells you not to drink. If you drink alcohol, limit how much you have: 0-1 drink a day for women. 0-2 drinks a day for men. Be aware of how much alcohol is in your drink. In the [place] one drink equals one typical bottle of beer (12 oz), one-half glass of wine (5 oz), or one shot of  hard liquor (1 oz). Do not use any products that contain nicotine or tobacco, such as cigarettes and e-cigarettes. If you need help quitting, ask your health care provider. Summary Having a healthy lifestyle and getting preventive care can help to protect your health and wellness after age 71. Screening and testing are the best way to find a health problem early and help you avoid having a fall. Early diagnosis and treatment give you the best chance for managing medical conditions that are more common for people who are older than age 71. Falls are a major cause of broken bones and head injuries in people who are older than age 71. Take precautions to prevent a fall at home. Work with your health care provider to learn what changes you can make to improve your health and wellness and to prevent falls. This information is not intended to replace advice given to you by your health care provider. Make sure you discuss any questions you have with your health care provider. Document Revised: 06/24/2020 Document Reviewed: 04/01/2020 Elsevier Patient Education  2022 Else[employer] Inc.  

## 2021-01-26 NOTE — Progress Notes (Signed)
William Dodson 70 y.o.   Chief Complaint  Patient presents with   Pneumonia    Follow up, pt feels better but still has concerns    HISTORY OF PRESENT ILLNESS: This is a 70 y.o. male here for follow-up of pneumonia. Seen by me last week and started on azithromycin. Much better.  Greatly improved.  Pneumonia There is no cough or shortness of breath. Pertinent negatives include no chest pain, fever, headaches or sore throat.    Prior to Admission medications   Medication Sig Start Date End Date Taking? Authorizing Provider  b complex vitamins tablet Take 1 tablet by mouth daily.   Yes [provider]  BIOTIN PO Take 50 mcg by mouth daily.   Yes [provider]  Cholecalciferol (VITAMIN D3) 5000 units CAPS Take 5,000 Units by mouth daily.   Yes [provider]  CHOLINE PO Take 20 mg by mouth daily.   Yes [provider]  Chromium Picolinate (CHROMIUM PICOLATE PO) Take 200 mcg by mouth daily.   Yes [provider]  Folic Acid (FOLATE PO) Take 200 mcg by mouth daily.   Yes [provider]  ibuprofen (ADVIL,MOTRIN) 200 MG tablet Take 200-400 mg by mouth every 8 (eight) hours as needed for moderate pain.   Yes [provider]  INOSITOL PO Take 50 mg by mouth daily.   Yes [provider]  [place] Oil 1000 MG CAPS Take 1,000 mg by mouth daily.   Yes [provider]  MAGNESIUM PO Take 500 mg by mouth daily.   Yes [provider]  Milk Thistle 175 MG CAPS Take 175 mg by mouth daily.   Yes [provider]  Misc Natural Products (SAW PALMETTO) CAPS Take 500 mg by mouth daily.   Yes [provider]  Niacin (VITAMIN B-3 PO) Take 50 mg by mouth daily.   Yes [provider]  P-Aminobenzoic Acid (PABA) 100 MG TABS Take 25 mg by mouth daily.   Yes [provider]  Pantothenic Acid 250 MG CAPS Take 125 mg by mouth daily.   Yes [provider]  Riboflavin (VITAMIN B-2)  25 MG TABS Take 25 mg by mouth daily.   Yes [provider]  thiamine (VITAMIN B-1) 50 MG tablet Take 25 mg by mouth daily.   Yes [provider]  Ubiquinol 100 MG CAPS Take 100 mg by mouth daily.   Yes [provider]  vitamin A 30160 UNIT capsule Take 10,000 Units by mouth daily.   Yes [provider]  vitamin B-12 (CYANOCOBALAMIN) 250 MCG tablet Take 125 mcg by mouth daily.   Yes [provider]  vitamin B-6 (PYRIDOXINE) 25 MG tablet Take 25 mg by mouth daily.   Yes [provider]  vitamin C (ASCORBIC ACID) 500 MG tablet Take 500 mg by mouth daily.   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   ONYCHOMYCOSIS  06/27/2006   HYPERLIPIDEMIA 06/27/2006   ATRIAL FIBRILLATION 06/27/2006    Past Medical History:  Diagnosis Date   Atrial fibrillation (HCC)    persistent,  s/p PVI by Dr Delena Serve at [facility]   Myalgia    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[facility])    "heart infection" in setting of pneumonia 1993   Sleep apnea    recent sleep study, results pending    Past Surgical History:  Procedure Laterality Date   ATRIAL ABLATION SURGERY  1999   afib ablation 1990s at [facility] by Ronn Melena   VENTRAL HERNIA REPAIR N/A 08/24/2016   Procedure: LAPAROSCOPIC VENTRAL HERNIA REPAIR WITH MESH;  Surgeon: Berna Bue, MD;  Location: [facility] OR;  Service: General;  Laterality: N/A;  Laparoscopic Repair of Umbilical and Supraumbilical Hernia with Mesh     Social History    Socioeconomic History   Marital status: Married    Spouse name: Not on file   Number of children: Not on file   Years of education: Not on file   Highest education level: Not on file  Occupational History   Occupation: Sculptor  Tobacco Use   Smoking status: Never   Smokeless tobacco: Never   Tobacco comments:    tried smoking 30 years ago  Vaping Use   Vaping Use: Never used  Substance and Sexual Activity   Alcohol use: Yes    Alcohol/week: 6.0 standard drinks    Types: 6 Glasses of wine per week    Comment: 1 glass of wine at night   Drug use: No   Sexual activity: Not on file  Other Topics Concern   Not on file  Social History Narrative   Married   Previous Programmer, systems [employer]   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, primarily with iron   Social Determinants of Health   Financial Resource Strain: Not on file  Food Insecurity: Not on file  Transportation Needs: Not on file  Physical Activity: Not on file  Stress: Not on file  Social Connections: Not on file  Intimate Partner Violence: Not on file    Family History  Problem Relation Age of Onset   Diabetes Mother    Cancer Mother    Mental illness Mother    Heart disease Father    Hyperlipidemia Father    Mental illness Brother    Diabetes Maternal Grandmother    Heart disease Paternal Grandmother      Review of Systems  Constitutional: Negative.  Negative for chills and fever.  HENT: Negative.  Negative for congestion and sore throat.   Respiratory: Negative.  Negative for cough and shortness of breath.   Cardiovascular: Negative.  Negative for chest pain and palpitations.  Gastrointestinal:  Negative for abdominal pain, diarrhea, nausea and vomiting.  Skin: Negative.  Negative for rash.  Neurological:  Negative for dizziness and headaches.  All other systems reviewed and are negative.  Today's Vitals   01/26/21 1432  BP: 124/76  Pulse: 77  Temp: 98.1 F (36.7 C)  TempSrc:  Oral  SpO2: 96%  Weight: 220 lb (99.8 kg)  Height: 5\' 3"  (1.6 m)   Body mass index is 38.97 kg/m.  Physical Exam Vitals reviewed.  Constitutional:      Appearance: Normal appearance.  HENT:     Head: Normocephalic.  Eyes:     Extraocular Movements: Extraocular movements intact.     Pupils: Pupils are equal, round, and reactive to light.  Cardiovascular:  Rate and Rhythm: Normal rate. Rhythm irregular.     Pulses: Normal pulses.     Heart sounds: Normal heart sounds.  Pulmonary:     Effort: Pulmonary effort is normal.     Breath sounds: Normal breath sounds.  Musculoskeletal:     Cervical back: Normal range of motion.  Neurological:     General: No focal deficit present.     Mental Status: He is alert and oriented to person, place, and time.  Psychiatric:        Mood and Affect: Mood normal.        Behavior: Behavior normal.     ASSESSMENT & PLAN: [place] was seen today for pneumonia.  Diagnoses and all orders for this visit:  Community acquired pneumonia of right lower lobe of lung Comments: Much improved  Chronic atrial fibrillation [facility])  Patient Instructions  Health Maintenance After Age 28 After age 39, you are at a higher risk for certain long-term diseases and infections as well as injuries from falls. Falls are a major cause of broken bones and head injuries in people who are older than age 30. Getting regular preventive care can help to keep you healthy and well. Preventive care includes getting regular testing and making lifestyle changes as recommended by your health care provider. Talk with your health care provider about: Which screenings and tests you should have. A screening is a test that checks for a disease when you have no symptoms. A diet and exercise plan that is right for you. What should I know about screenings and tests to prevent falls? Screening and testing are the best ways to find a health problem early. Early diagnosis and treatment  give you the best chance of managing medical conditions that are common after age 61. Certain conditions and lifestyle choices may make you more likely to have a fall. Your health care provider may recommend: Regular vision checks. Poor vision and conditions such as cataracts can make you more likely to have a fall. If you wear glasses, make sure to get your prescription updated if your vision changes. Medicine review. Work with your health care provider to regularly review all of the medicines you are taking, including over-the-counter medicines. Ask your health care provider about any side effects that may make you more likely to have a fall. Tell your health care provider if any medicines that you take make you feel dizzy or sleepy. Osteoporosis screening. Osteoporosis is a condition that causes the bones to get weaker. This can make the bones weak and cause them to break more easily. Blood pressure screening. Blood pressure changes and medicines to control blood pressure can make you feel dizzy. Strength and balance checks. Your health care provider may recommend certain tests to check your strength and balance while standing, walking, or changing positions. Foot health exam. Foot pain and numbness, as well as not wearing proper footwear, can make you more likely to have a fall. Depression screening. You may be more likely to have a fall if you have a fear of falling, feel emotionally low, or feel unable to do activities that you used to do. Alcohol use screening. Using too much alcohol can affect your balance and may make you more likely to have a fall. What actions can I take to lower my risk of falls? General instructions Talk with your health care provider about your risks for falling. Tell your health care provider if: You fall. Be sure to tell your health care provider  about all falls, even ones that seem minor. You feel dizzy, sleepy, or off-balance. Take over-the-counter and prescription  medicines only as told by your health care provider. These include any supplements. Eat a healthy diet and maintain a healthy weight. A healthy diet includes low-fat dairy products, low-fat (lean) meats, and fiber from whole grains, beans, and lots of fruits and vegetables. Home safety Remove any tripping hazards, such as rugs, cords, and clutter. Install safety equipment such as grab bars in bathrooms and safety rails on stairs. Keep rooms and walkways well-lit. Activity  Follow a regular exercise program to stay fit. This will help you maintain your balance. Ask your health care provider what types of exercise are appropriate for you. If you need a cane or walker, use it as recommended by your health care provider. Wear supportive shoes that have nonskid soles. Lifestyle Do not drink alcohol if your health care provider tells you not to drink. If you drink alcohol, limit how much you have: 0-1 drink a day for women. 0-2 drinks a day for men. Be aware of how much alcohol is in your drink. In the [place] one drink equals one typical bottle of beer (12 oz), one-half glass of wine (5 oz), or one shot of hard liquor (1 oz). Do not use any products that contain nicotine or tobacco, such as cigarettes and e-cigarettes. If you need help quitting, ask your health care provider. Summary Having a healthy lifestyle and getting preventive care can help to protect your health and wellness after age 51. Screening and testing are the best way to find a health problem early and help you avoid having a fall. Early diagnosis and treatment give you the best chance for managing medical conditions that are more common for people who are older than age 10. Falls are a major cause of broken bones and head injuries in people who are older than age 76. Take precautions to prevent a fall at home. Work with your health care provider to learn what changes you can make to improve your health and wellness and to prevent  falls. This information is not intended to replace advice given to you by your health care provider. Make sure you discuss any questions you have with your health care provider. Document Revised: 06/24/2020 Document Reviewed: 04/01/2020 Elsevier Patient Education  2022 Elsevier Inc.     Edwina Barth, MD [facility]

## 2021-03-07 DIAGNOSIS — G4733 Obstructive sleep apnea (adult) (pediatric): Secondary | ICD-10-CM | POA: Diagnosis not present

## 2021-06-05 DIAGNOSIS — Z880 Allergy status to penicillin: Secondary | ICD-10-CM | POA: Diagnosis not present

## 2021-06-05 DIAGNOSIS — Z7722 Contact with and (suspected) exposure to environmental tobacco smoke (acute) (chronic): Secondary | ICD-10-CM | POA: Diagnosis not present

## 2021-06-05 DIAGNOSIS — G4733 Obstructive sleep apnea (adult) (pediatric): Secondary | ICD-10-CM | POA: Diagnosis not present

## 2021-06-05 DIAGNOSIS — R030 Elevated blood-pressure reading, without diagnosis of hypertension: Secondary | ICD-10-CM | POA: Diagnosis not present

## 2021-06-05 DIAGNOSIS — Z882 Allergy status to sulfonamides status: Secondary | ICD-10-CM | POA: Diagnosis not present

## 2021-06-05 DIAGNOSIS — Z8249 Family history of ischemic heart disease and other diseases of the circulatory system: Secondary | ICD-10-CM | POA: Diagnosis not present

## 2021-06-05 DIAGNOSIS — Z6841 Body Mass Index (BMI) 40.0 and over, adult: Secondary | ICD-10-CM | POA: Diagnosis not present

## 2021-11-22 DIAGNOSIS — H43811 Vitreous degeneration, right eye: Secondary | ICD-10-CM | POA: Diagnosis not present

## 2021-11-22 DIAGNOSIS — H5319 Other subjective visual disturbances: Secondary | ICD-10-CM | POA: Diagnosis not present

## 2022-01-02 DIAGNOSIS — H16223 Keratoconjunctivitis sicca, not specified as Sjogren's, bilateral: Secondary | ICD-10-CM | POA: Diagnosis not present

## 2022-01-02 DIAGNOSIS — H43811 Vitreous degeneration, right eye: Secondary | ICD-10-CM | POA: Diagnosis not present

## 2022-01-02 DIAGNOSIS — H2513 Age-related nuclear cataract, bilateral: Secondary | ICD-10-CM | POA: Diagnosis not present

## 2022-01-04 NOTE — Progress Notes (Signed)
Cardiology Office Note Date:  01/04/2022  Patient ID:  William Dodson, William Dodson [birth year], MRN [id number] PCP:  Georgina Quint, MD  Cardiologist/Electrophysiologist: Dr. Elberta Fortis    Chief Complaint: annual visit  History of Present Illness: William Dodson is a 71 y.o. male with history of myocarditis associated with pneumonia 1993, OSA w/CPAP, AFib  He comes in today to be seen for Dr. Elberta Fortis, last seen by him Aug 2020, at that time noted more DOE/fatigue, increased rate with exertion, though slows quickly with rest, compliant with CPAP. Discussed increasingly symptomatic with his AF, recommended starting flecainide, and DCCV, not anticoagulated with low risk score., he did not think repeat ablation was an option with suspect non-pulmonary vein triggers. The pt wanted to avoid medicines, recommended 21mo visit.  I don't see he was started on meds, did not have f/u.  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   He denies any CP, no palpitations or overt cardiac awareness.  His AFib symptom is reduced exertional capacity, less energy over all. He has started doing [place] and is walking for exercise, will get winded with hills, but feels quite well with exercise, particularly invigorated afterwards. No dizziness, near syncope or syncope. He reports 100% compliant with his CPAP, sleeps very well and uninterrupted.  We discussed his PMHx and CHA2DS2Vasc score.  He has had no change in his medical history or any new diagnosis' His score remians one for age This patients CHA2DS2-VASc Score and unadjusted Ischemic Stroke Rate (% per year) is equal to 0.6 % stroke rate/year from a score of 1 Above score calculated as 1 point each if present [CHF, HTN, DM, Vascular=MI/PAD/Aortic Plaque, Age if 65-74, or Male] Above score calculated as 2 points each if present [Age > 75, or Stroke/TIA/TE] He did not want to make any changes with plans for rate  control strategy and no a/c   TODAY His wife recently suffered a cryptogenic stroke. This has made him start to think hard about his decisions and management of his Afib. He feels well Continues to work and loves it, he is very busy and feels well No CP, cardiac awareness No SOB No near syncope or syncope.   AFib hx PVI ablation [facility] in 1997 by Dr. 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[facility])    "heart infection" in setting of pneumonia 1993   Sleep apnea    recent sleep study, results pending    Past Surgical History:  Procedure Laterality Date   ATRIAL ABLATION SURGERY  1999   afib ablation 1990s at [facility] by Ronn Melena   VENTRAL HERNIA REPAIR N/A 08/24/2016   Procedure: LAPAROSCOPIC VENTRAL HERNIA REPAIR WITH MESH;  Surgeon: Berna Bue, MD;  Location: [facility] OR;  Service: General;  Laterality: N/A;  Laparoscopic Repair of Umbilical and Supraumbilical Hernia with Mesh     Current Outpatient Medications  Medication Sig Dispense Refill   b complex vitamins tablet Take 1 tablet by mouth daily.     BIOTIN PO Take 50 mcg by mouth daily.     Cholecalciferol (VITAMIN D3) 5000 units CAPS Take 5,000 Units by mouth daily.     CHOLINE PO Take 20 mg by mouth daily.     Chromium Picolinate (CHROMIUM PICOLATE PO) Take 200 mcg by  mouth daily.     Folic Acid (FOLATE PO) Take 200 mcg by mouth daily.     ibuprofen (ADVIL,MOTRIN) 200 MG tablet Take 200-400 mg by mouth every 8 (eight) hours as needed for moderate pain.     INOSITOL PO Take 50 mg by mouth daily.     Krill Oil 1000 MG CAPS Take 1,000 mg by mouth daily.     MAGNESIUM PO Take 500 mg by mouth daily.     Milk Thistle 175 MG CAPS Take 175 mg by mouth daily.     Misc Natural Products (SAW PALMETTO) CAPS Take 500 mg by mouth daily.     Niacin (VITAMIN B-3 PO) Take 50 mg by mouth daily.     P-Aminobenzoic Acid  (PABA) 100 MG TABS Take 25 mg by mouth daily.     Pantothenic Acid 250 MG CAPS Take 125 mg by mouth daily.     Riboflavin (VITAMIN B-2) 25 MG TABS Take 25 mg by mouth daily.     thiamine (VITAMIN B-1) 50 MG tablet Take 25 mg by mouth daily.     Ubiquinol 100 MG CAPS Take 100 mg by mouth daily.     vitamin A 08144 UNIT capsule Take 10,000 Units by mouth daily.     vitamin B-12 (CYANOCOBALAMIN) 250 MCG tablet Take 125 mcg by mouth daily.     vitamin B-6 (PYRIDOXINE) 25 MG tablet Take 25 mg by mouth daily.     vitamin C (ASCORBIC ACID) 500 MG tablet Take 500 mg by mouth daily.     No current facility-administered medications for this visit.    Allergies:   Morphine and related, Penicillins, Doxycycline, Hydrocodone, and Septra [bactrim]   Social History:  The patient  reports that he has never smoked. He has never used smokeless tobacco. He reports current alcohol use of about 6.0 standard drinks of alcohol per week. He reports that he does not use drugs.   Family History:  The patient's family history includes Cancer in his mother; Diabetes in his maternal grandmother and mother; Heart disease in his father and paternal grandmother; Hyperlipidemia in his father; Mental illness in his brother and mother.  ROS:  Please see the history of present illness.    All other systems are reviewed and otherwise negative.   PHYSICAL EXAM:  VS:  There were no vitals taken for this visit. BMI: There is no height or weight on file to calculate BMI. Well nourished, well developed, in no acute distress HEENT: normocephalic, atraumatic Neck: no JVD, carotid bruits or masses Cardiac:  irreg-irreg; no significant murmurs, no rubs, or gallops Lungs:  CTA b/l, no wheezing, rhonchi or rales Abd: soft, nontender, obese MS: no deformity or atrophy Ext: no edema Skin: warm and dry, no rash Neuro:  No gross deficits appreciated Psych: euthymic mood, full affect    EKG:  Done today and reviewed by myself  shows  Afib 87bpm, PVC  07/27/2020: carotid US Summary:  Right Carotid: There is no evidence of stenosis in the right ICA. The extracranial vessels were near-normal with only minimal  wall thickening or plaque.   Left Carotid: There is no evidence of stenosis in the left ICA. The  extracranial vessels were near-normal with only minimal wall thickening  or plaque.   Vertebrals:  Bilateral vertebral arteries demonstrate antegrade flow.  Subclavians: Normal flow hemodynamics were seen in bilateral subclavian arteries.    05/02/2018: TTE Study Conclusions  - Left ventricle: The cavity size was normal.  There was mild    concentric hypertrophy. Systolic function was normal. The    estimated ejection fraction was in the range of 50% to 55%. Wall    motion was normal; there were no regional wall motion    abnormalities.  - Aortic valve: There was mild regurgitation. Regurgitation    pressure half-time: 514 ms.  - Mitral valve: Transvalvular velocity was within the normal range.    There was no evidence for stenosis. There was trivial    regurgitation.  - Left atrium: The atrium was mildly dilated.  - Right ventricle: Systolic function was normal.  - Atrial septum: No defect or patent foramen ovale was identified    by color flow Doppler.  - Tricuspid valve: There was mild regurgitation.  - Pulmonary arteries: Systolic pressure was within the normal    range. PA peak pressure: 25 mm Hg (S).    Recent Labs: No results found for requested labs within last 365 days.  No results found for requested labs within last 365 days.   CrCl cannot be calculated (Patient's most recent lab result is older than the maximum 21 days allowed.).   Wt Readings from Last 3 Encounters:  01/26/21 220 lb (99.8 kg)  01/19/21 217 lb (98.4 kg)  04/11/20 224 lb 9.6 oz (101.9 kg)     Other studies reviewed: Additional studies/records reviewed today include: summarized above  ASSESSMENT AND PLAN:  1.  Longstanding persistent AFib     CHA2DS2Vasc is remains one for age, pt reports no new medical diagnosis/change to his PMHx      We again discussed at length management strategies for Afib as well as his risk score and embolic/stroke risk. At this time, he remains most comfortable with his current strategy, rate controlled, and no a/c, though will discuss further with his wife.  Should he decide differently he will let us know, understanding that if we decide dto pursue any kind of rhythm control will need to get on [facility]  He is on numerous supplements and has been previously cautioned on over use and care with these. He sees his PMD soon for a physical and labs   Disposition: will have him see Dr. Elberta Fortis in 31mo, sooner if needed  Current medicines are reviewed at length with the patient today.  The patient did not have any concerns regarding medicines.  Norma Fredrickson, PA-C 01/04/2022 5:04 PM     [facility] [address] [place] [address] [phone] (office)  [phone] (fax)

## 2022-01-05 ENCOUNTER — Ambulatory Visit: Payer: Medicare PPO | Attending: Physician Assistant | Admitting: Physician Assistant

## 2022-01-05 ENCOUNTER — Encounter: Payer: Self-pay | Admitting: Physician Assistant

## 2022-01-05 VITALS — BP 120/82 | HR 87 | Ht 63.0 in | Wt 223.0 lb

## 2022-01-05 DIAGNOSIS — I4811 Longstanding persistent atrial fibrillation: Secondary | ICD-10-CM

## 2022-01-05 DIAGNOSIS — Z79899 Other long term (current) drug therapy: Secondary | ICD-10-CM

## 2022-01-05 NOTE — Patient Instructions (Signed)
Medication Instructions:   Your physician recommends that you continue on your current medications as directed. Please refer to the Current Medication list given to you today.  *If you need a refill on your cardiac medications before your next appointment, please call your pharmacy*   Lab Work: NONE ORDERED  TODAY   If you have labs (blood work) drawn today and your tests are completely normal, you will receive your results only by: MyChart Message (if you have MyChart) OR A paper copy in the mail If you have any lab test that is abnormal or we need to change your treatment, we will call you to review the results.   Testing/Procedures: NONE ORDERED  TODAY     Follow-Up: At [facility], you and your health needs are our priority.  As part of our continuing mission to provide you with exceptional heart care, we have created designated Provider Care Teams.  These Care Teams include your primary Cardiologist (physician) and Advanced Practice Providers (APPs -  Physician Assistants and Nurse Practitioners) who all work together to provide you with the care you need, when you need it.  We recommend signing up for the patient portal called "MyChart".  Sign up information is provided on this After Visit Summary.  MyChart is used to connect with patients for Virtual Visits (Telemedicine).  Patients are able to view lab/test results, encounter notes, upcoming appointments, etc.  Non-urgent messages can be sent to your provider as well.   To learn more about what you can do with MyChart, go to [email or web address].    Your next appointment:   6 month(s)  The format for your next appointment:   In Person  Provider:   You may see Dr Camnitz or one of the following Advanced Practice Providers on your designated Care Team:      Other Instructions   Important Information About Sugar       

## 2022-04-17 ENCOUNTER — Encounter: Payer: Self-pay | Admitting: Emergency Medicine

## 2022-04-17 ENCOUNTER — Ambulatory Visit: Payer: Medicare PPO | Admitting: Emergency Medicine

## 2022-04-17 ENCOUNTER — Ambulatory Visit (INDEPENDENT_AMBULATORY_CARE_PROVIDER_SITE_OTHER)
Admission: RE | Admit: 2022-04-17 | Discharge: 2022-04-17 | Disposition: A | Discharge status: Home / Self Care | Payer: Medicare PPO | Source: Ambulatory Visit | Attending: Emergency Medicine | Admitting: Emergency Medicine

## 2022-04-17 VITALS — BP 134/76 | HR 85 | Temp 98.0°F | Ht 63.0 in | Wt 225.0 lb

## 2022-04-17 DIAGNOSIS — M25551 Pain in right hip: Secondary | ICD-10-CM

## 2022-04-17 DIAGNOSIS — I4891 Unspecified atrial fibrillation: Secondary | ICD-10-CM | POA: Diagnosis not present

## 2022-04-17 DIAGNOSIS — Z23 Encounter for immunization: Secondary | ICD-10-CM

## 2022-04-17 MED ORDER — MELOXICAM 15 MG PO TABS: 15.0000 mg | ORAL_TABLET | Freq: Every day | ORAL | 1 refills | Status: AC

## 2022-04-17 NOTE — Assessment & Plan Note (Signed)
Chronic and stable.  Asymptomatic.  On no medications. Sees cardiologist on a regular basis.

## 2022-04-17 NOTE — Progress Notes (Signed)
William CopasJames E Mimbs 71 y.o.   Chief Complaint  Patient presents with   Acute Visit    Right sided hip pain x 4 weeks , sharp pain on hip bone     HISTORY OF PRESENT ILLNESS: This is a 71 y.o. male complaining of sharp pain to right hip area that started about 4 weeks ago while turning his leg in sitting position. Intermittent pain since worse with certain movements and not associated with any other symptoms.  HPI   Prior to Admission medications   Medication Sig Start Date End Date Taking? Authorizing Provider  b complex vitamins tablet Take 1 tablet by mouth daily.   Yes [provider]  BIOTIN PO Take 50 mcg by mouth daily.   Yes [provider]  Cholecalciferol (VITAMIN D3) 5000 units CAPS Take 5,000 Units by mouth daily.   Yes [provider]  CHOLINE PO Take 20 mg by mouth daily.   Yes [provider]  Chromium Picolinate (CHROMIUM PICOLATE PO) Take 200 mcg by mouth daily.   Yes [provider]  Folic Acid (FOLATE PO) Take 200 mcg by mouth daily.   Yes [provider]  ibuprofen (ADVIL,MOTRIN) 200 MG tablet Take 200-400 mg by mouth every 8 (eight) hours as needed for moderate pain.   Yes [provider]  INOSITOL PO Take 50 mg by mouth daily.   Yes [provider]  Boris LownKrill Oil 1000 MG CAPS Take 1,000 mg by mouth daily.   Yes [provider]  MAGNESIUM PO Take 500 mg by mouth daily.   Yes [provider]  Milk Thistle 175 MG CAPS Take 175 mg by mouth daily.   Yes [provider]  Misc Natural Products (SAW PALMETTO) CAPS Take 500 mg by mouth daily.   Yes [provider]  Niacin (VITAMIN B-3 PO) Take 50 mg by mouth daily.   Yes [provider]  P-Aminobenzoic Acid (PABA) 100 MG TABS Take 25 mg by mouth daily.   Yes [provider]  Pantothenic Acid 250 MG CAPS Take 125 mg by mouth daily.   Yes [provider]  Riboflavin (VITAMIN B-2) 25 MG TABS Take  25 mg by mouth daily.   Yes [provider]  thiamine (VITAMIN B-1) 50 MG tablet Take 25 mg by mouth daily.   Yes [provider]  Ubiquinol 100 MG CAPS Take 100 mg by mouth daily.   Yes [provider]  vitamin A 1610910000 UNIT capsule Take 10,000 Units by mouth daily.   Yes [provider]  vitamin B-12 (CYANOCOBALAMIN) 250 MCG tablet Take 125 mcg by mouth daily.   Yes [provider]  vitamin B-6 (PYRIDOXINE) 25 MG tablet Take 25 mg by mouth daily.   Yes [provider]  vitamin C (ASCORBIC ACID) 500 MG tablet Take 500 mg by mouth daily.   Yes [provider]    Allergies  Allergen Reactions   Morphine And Related Other (See Comments)    Increased hearing, hallucinations, weird feeling   Penicillins    Doxycycline Itching and Rash        Hydrocodone Itching and Rash   Septra [Bactrim] Itching and Rash         Patient Active Problem List   Diagnosis Date Noted   Obstructive sleep apnea 06/28/2011   Sleep apnea 01/29/2011   Chronic systolic dysfunction of left ventricle 01/29/2011   ONYCHOMYCOSIS 06/27/2006   HYPERLIPIDEMIA 06/27/2006   ATRIAL FIBRILLATION 06/27/2006  Past Medical History:  Diagnosis Date   Atrial fibrillation (HCC)    persistent,  s/p PVI by Dr Delena Serve at [facility]   Myalgia    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[facility])    "heart infection" in setting of pneumonia 1993   Sleep apnea    recent sleep study, results pending    Past Surgical History:  Procedure Laterality Date   ATRIAL ABLATION SURGERY  1999   afib ablation 1990s at [facility] by Ronn Melena   VENTRAL HERNIA REPAIR N/A 08/24/2016   Procedure: LAPAROSCOPIC VENTRAL HERNIA REPAIR WITH MESH;  Surgeon: Berna Bue, MD;  Location: [facility] OR;  Service: General;  Laterality: N/A;  Laparoscopic Repair of Umbilical and Supraumbilical Hernia with Mesh     Social History   Socioeconomic History   Marital status: Married    Spouse name: Not on file   Number  of children: Not on file   Years of education: Not on file   Highest education level: Not on file  Occupational History   Occupation: Sculptor  Tobacco Use   Smoking status: Never   Smokeless tobacco: Never   Tobacco comments:    tried smoking 30 years ago  Vaping Use   Vaping Use: Never used  Substance and Sexual Activity   Alcohol use: Yes    Alcohol/week: 6.0 standard drinks of alcohol    Types: 6 Glasses of wine per week    Comment: 1 glass of wine at night   Drug use: No   Sexual activity: Not on file  Other Topics Concern   Not on file  Social History Narrative   Married   Previous Programmer, systems [employer]   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, primarily with iron   Social Determinants of Health   Financial Resource Strain: Not on file  Food Insecurity: Not on file  Transportation Needs: Not on file  Physical Activity: Not on file  Stress: Not on file  Social Connections: Not on file  Intimate Partner Violence: Not on file    Family History  Problem Relation Age of Onset   Diabetes Mother    Cancer Mother    Mental illness Mother    Heart disease Father    Hyperlipidemia Father    Mental illness Brother    Diabetes Maternal Grandmother    Heart disease Paternal Grandmother      Review of Systems  Constitutional: Negative.  Negative for chills and fever.  HENT: Negative.  Negative for congestion and sore throat.   Respiratory: Negative.  Negative for cough and shortness of breath.   Cardiovascular: Negative.  Negative for chest pain and palpitations.  Gastrointestinal:  Negative for abdominal pain, nausea and vomiting.  Genitourinary: Negative.  Negative for dysuria and hematuria.  Skin: Negative.  Negative for rash.  Neurological: Negative.  Negative for dizziness and headaches.  All other systems reviewed and are negative.  Today's Vitals   04/17/22 0851  BP: 134/76  Pulse: 85  Temp: 98 F (36.7 C)  TempSrc: Oral  SpO2: 95%   Weight: 225 lb (102.1 kg)  Height: 5\' 3"  (1.6 m)   Body mass index is 39.86 kg/m.   Physical Exam Vitals reviewed.  Constitutional:      Appearance: Normal appearance.  HENT:     Head: Normocephalic.  Eyes:     Extraocular Movements: Extraocular movements intact.     Pupils: Pupils are equal, round, and reactive to light.  Cardiovascular:  Rate and Rhythm: Normal rate. Rhythm irregular.     Pulses: Normal pulses.     Heart sounds: Normal heart sounds.  Pulmonary:     Effort: Pulmonary effort is normal.     Breath sounds: Normal breath sounds.  Abdominal:     Palpations: Abdomen is soft. There is no mass.     Tenderness: There is no abdominal tenderness.  Musculoskeletal:     Right lower leg: No edema.     Left lower leg: No edema.     Comments: Right hip: Full range of motion.  No significant tenderness. Lower extremities: Within normal limits Left hip: Within normal limits  Skin:    General: Skin is warm and dry.     Capillary Refill: Capillary refill takes less than 2 seconds.  Neurological:     General: No focal deficit present.     Mental Status: He is alert and oriented to person, place, and time.  Psychiatric:        Mood and Affect: Mood normal.        Behavior: Behavior normal.    DG HIP UNILAT W OR W/O PELVIS 2-3 VIEWS RIGHT  Result Date: 04/17/2022 CLINICAL DATA:  Right hip pain EXAM: DG HIP (WITH OR WITHOUT PELVIS) 2-3V RIGHT COMPARISON:  07/07/2016 FINDINGS: There is no evidence of hip fracture or dislocation. No signs of pelvic diastasis. There is mild bilateral and symmetric hip osteoarthritis. Degenerative disc disease noted within the imaged portions of the lumbar spine. IMPRESSION: 1. No acute findings. 2. Mild bilateral hip osteoarthritis. Electronically Signed   By: Signa Kell M.D.   On: 04/17/2022 10:40     ASSESSMENT & PLAN: A total of 42 minutes was spent with the patient and counseling/coordination of care regarding preparing for this  visit, review of most recent office visit notes, review of chronic medical conditions and their management, review of all medications, differential diagnosis of right hip pain, review of x-ray report, pain management, prognosis, documentation, need for follow-up with orthopedics.  Problem List Items Addressed This Visit       Cardiovascular and Mediastinum   ATRIAL FIBRILLATION    Chronic and stable.  Asymptomatic.  On no medications. Sees cardiologist on a regular basis.        Other   Right hip pain - Primary    Active and affecting quality of life. X-ray shows mild bilateral osteoarthritis Pain management discussed May benefit from daily meloxicam 15 mg for 10 days Recommend orthopedic evaluation Referral placed to sports medicine today      Relevant Medications   meloxicam (MOBIC) 15 MG tablet   Other Relevant Orders   Ambulatory referral to Sports Medicine   DG HIP UNILAT W OR W/O PELVIS 2-3 VIEWS RIGHT (Completed)   Other Visit Diagnoses     Need for vaccination       Relevant Orders   Flu Vaccine QUAD High Dose(Fluad) (Completed)   Pneumococcal conjugate vaccine 20-valent (Prevnar 20) (Completed)      Patient Instructions  Hip Pain The hip is the joint between the upper legs and the lower pelvis. The bones, cartilage, tendons, and muscles of your hip joint support your body and allow you to move around. Hip pain can range from a minor ache to severe pain in one or both of your hips. The pain may be felt on the inside of the hip joint near the groin, or on the outside near the buttocks and upper thigh. You may also have  swelling or stiffness in your hip area. Follow these instructions at home: Managing pain, stiffness, and swelling     If directed, put ice on the painful area. To do this: Put ice in a plastic bag. Place a towel between your skin and the bag. Leave the ice on for 20 minutes, 2-3 times a day. If directed, apply heat to the affected area as often  as told by your health care provider. Use the heat source that your health care provider recommends, such as a moist heat pack or a heating pad. Place a towel between your skin and the heat source. Leave the heat on for 20-30 minutes. Remove the heat if your skin turns bright red. This is especially important if you are unable to feel pain, heat, or cold. You may have a greater risk of getting burned. Activity Do exercises as told by your health care provider. Avoid activities that cause pain. General instructions  Take over-the-counter and prescription medicines only as told by your health care provider. Keep a journal of your symptoms. Write down: How often you have hip pain. The location of your pain. What the pain feels like. What makes the pain worse. Sleep with a pillow between your legs on your most comfortable side. Keep all follow-up visits as told by your health care provider. This is important. Contact a health care provider if: You cannot put weight on your leg. Your pain or swelling continues or gets worse after one week. It gets harder to walk. You have a fever. Get help right away if: You fall. You have a sudden increase in pain and swelling in your hip. Your hip is red or swollen or very tender to touch. Summary Hip pain can range from a minor ache to severe pain in one or both of your hips. The pain may be felt on the inside of the hip joint near the groin, or on the outside near the buttocks and upper thigh. Avoid activities that cause pain. Write down how often you have hip pain, the location of the pain, what makes it worse, and what it feels like. This information is not intended to replace advice given to you by your health care provider. Make sure you discuss any questions you have with your health care provider. Document Revised: 09/01/2018 Document Reviewed: 09/01/2018 Elsevier Patient Education  2023 Elsevier Inc.    Edwina Barth, MD [facility]

## 2022-04-17 NOTE — Addendum Note (Signed)
Addended by: Evie Lacks on: 04/17/2022 11:55 AM   Modules accepted: Level of Service

## 2022-04-17 NOTE — Patient Instructions (Signed)
Hip Pain The hip is the joint between the upper legs and the lower pelvis. The bones, cartilage, tendons, and muscles of your hip joint support your body and allow you to move around. Hip pain can range from a minor ache to severe pain in one or both of your hips. The pain may be felt on the inside of the hip joint near the groin, or on the outside near the buttocks and upper thigh. You may also have swelling or stiffness in your hip area. Follow these instructions at home: Managing pain, stiffness, and swelling     If directed, put ice on the painful area. To do this: Put ice in a plastic bag. Place a towel between your skin and the bag. Leave the ice on for 20 minutes, 2-3 times a day. If directed, apply heat to the affected area as often as told by your health care provider. Use the heat source that your health care provider recommends, such as a moist heat pack or a heating pad. Place a towel between your skin and the heat source. Leave the heat on for 20-30 minutes. Remove the heat if your skin turns bright red. This is especially important if you are unable to feel pain, heat, or cold. You may have a greater risk of getting burned. Activity Do exercises as told by your health care provider. Avoid activities that cause pain. General instructions  Take over-the-counter and prescription medicines only as told by your health care provider. Keep a journal of your symptoms. Write down: How often you have hip pain. The location of your pain. What the pain feels like. What makes the pain worse. Sleep with a pillow between your legs on your most comfortable side. Keep all follow-up visits as told by your health care provider. This is important. Contact a health care provider if: You cannot put weight on your leg. Your pain or swelling continues or gets worse after one week. It gets harder to walk. You have a fever. Get help right away if: You fall. You have a sudden increase in pain  and swelling in your hip. Your hip is red or swollen or very tender to touch. Summary Hip pain can range from a minor ache to severe pain in one or both of your hips. The pain may be felt on the inside of the hip joint near the groin, or on the outside near the buttocks and upper thigh. Avoid activities that cause pain. Write down how often you have hip pain, the location of the pain, what makes it worse, and what it feels like. This information is not intended to replace advice given to you by your health care provider. Make sure you discuss any questions you have with your health care provider. Document Revised: 09/01/2018 Document Reviewed: 09/01/2018 Elsevier Patient Education  2023 Elsevier Inc.  

## 2022-04-17 NOTE — Assessment & Plan Note (Signed)
Active and affecting quality of life. X-ray shows mild bilateral osteoarthritis Pain management discussed May benefit from daily meloxicam 15 mg for 10 days Recommend orthopedic evaluation Referral placed to sports medicine today

## 2022-04-18 ENCOUNTER — Ambulatory Visit: Payer: Self-pay

## 2022-04-18 ENCOUNTER — Ambulatory Visit (INDEPENDENT_AMBULATORY_CARE_PROVIDER_SITE_OTHER): Payer: Medicare PPO | Admitting: Family Medicine

## 2022-04-18 ENCOUNTER — Encounter: Payer: Self-pay | Admitting: Family Medicine

## 2022-04-18 VITALS — BP 118/82 | HR 95 | Ht 63.0 in | Wt 226.0 lb

## 2022-04-18 DIAGNOSIS — M25551 Pain in right hip: Secondary | ICD-10-CM | POA: Diagnosis not present

## 2022-04-18 DIAGNOSIS — M2550 Pain in unspecified joint: Secondary | ICD-10-CM

## 2022-04-18 LAB — IBC PANEL
Iron: 67 ug/dL (ref 42–165)
Saturation Ratios: 18.5 % — ABNORMAL LOW (ref 20.0–50.0)
TIBC: 362.6 ug/dL (ref 250.0–450.0)
Transferrin: 259 mg/dL (ref 212.0–360.0)

## 2022-04-18 LAB — VITAMIN D 25 HYDROXY (VIT D DEFICIENCY, FRACTURES): VITD: 37.64 ng/mL (ref 30.00–100.00)

## 2022-04-18 LAB — FERRITIN: Ferritin: 380.1 ng/mL — ABNORMAL HIGH (ref 22.0–322.0)

## 2022-04-18 NOTE — Progress Notes (Signed)
[name] [address] [place] [address] Phone: [phone] Subjective:   William Dodson, am serving as a scribe for Dr. Antoine Dodson.  I'm seeing this patient by the request  of:  Sagardia, Eilleen Kempf, MD  CC: Right hip pain  ION:[id number]  William Dodson is a 71 y.o. male coming in with complaint of R hip pain. Patient states that 4 weeks ago he started to have pain in ASIS after leaning over and twisting to the R. Has had 3 similar incidences of pain since the initial time.    Xray 04/17/2022 R hip IMPRESSION: 1. No acute findings. 2. Mild bilateral hip osteoarthritis.     Past Medical History:  Diagnosis Date   Atrial fibrillation (HCC)    persistent,  s/p PVI by Dr Delena Serve at [facility]   Myalgia    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[facility])    "heart infection" in setting of pneumonia 1993   Sleep apnea    recent sleep study, results pending   Past Surgical History:  Procedure Laterality Date   ATRIAL ABLATION SURGERY  1999   afib ablation 1990s at [facility] by Ronn Melena   VENTRAL HERNIA REPAIR N/A 08/24/2016   Procedure: LAPAROSCOPIC VENTRAL HERNIA REPAIR WITH MESH;  Surgeon: Berna Bue, MD;  Location: [facility] OR;  Service: General;  Laterality: N/A;  Laparoscopic Repair of Umbilical and Supraumbilical Hernia with Mesh    Social History   Socioeconomic History   Marital status: Married    Spouse name: Not on file   Number of children: Not on file   Years of education: Not on file   Highest education level: Not on file  Occupational History   Occupation: Sculptor  Tobacco Use   Smoking status: Never   Smokeless tobacco: Never   Tobacco comments:    tried smoking 30 years ago  Vaping Use   Vaping Use: Never used  Substance and Sexual Activity   Alcohol use: Yes    Alcohol/week: 6.0 standard drinks of alcohol    Types: 6 Glasses of wine per week    Comment: 1 glass of wine at night   Drug use: No   Sexual activity: Not on file   Other Topics Concern   Not on file  Social History Narrative   Married   Previous Programmer, 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[employer]   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, primarily with iron   Social Determinants of Health   Financial Resource Strain: Not on file  Food Insecurity: Not on file  Transportation Needs: Not on file  Physical Activity: Not on file  Stress: Not on file  Social Connections: Not on file   Allergies  Allergen Reactions   Morphine And Related Other (See Comments)    Increased hearing, hallucinations, weird feeling   Penicillins    Doxycycline Itching and Rash        Hydrocodone Itching and Rash   Septra [Bactrim] Itching and Rash        Family History  Problem Relation Age of Onset   Diabetes Mother    Cancer Mother    Mental illness Mother    Heart disease Father    Hyperlipidemia Father    Mental illness Brother    Diabetes Maternal Grandmother    Heart disease Paternal Grandmother        Current Outpatient Medications (Analgesics):    ibuprofen (ADVIL,MOTRIN) 200 MG tablet, Take 200-400 mg by mouth  every 8 (eight) hours as needed for moderate pain.   meloxicam (MOBIC) 15 MG tablet, Take 1 tablet (15 mg total) by mouth daily.  Current Outpatient Medications (Hematological):    Folic Acid (FOLATE PO), Take 200 mcg by mouth daily.   vitamin B-12 (CYANOCOBALAMIN) 250 MCG tablet, Take 125 mcg by mouth daily.  Current Outpatient Medications (Other):    b complex vitamins tablet, Take 1 tablet by mouth daily.   BIOTIN PO, Take 50 mcg by mouth daily.   Cholecalciferol (VITAMIN D3) 5000 units CAPS, Take 5,000 Units by mouth daily.   CHOLINE PO, Take 20 mg by mouth daily.   Chromium Picolinate (CHROMIUM PICOLATE PO), Take 200 mcg by mouth daily.   INOSITOL PO, Take 50 mg by mouth daily.   Krill Oil 1000 MG CAPS, Take 1,000 mg by mouth daily.   MAGNESIUM PO, Take 500 mg by mouth daily.   Milk Thistle 175 MG CAPS, Take 175 mg by mouth daily.   Misc  Natural Products (SAW PALMETTO) CAPS, Take 500 mg by mouth daily.   Niacin (VITAMIN B-3 PO), Take 50 mg by mouth daily.   P-Aminobenzoic Acid (PABA) 100 MG TABS, Take 25 mg by mouth daily.   Pantothenic Acid 250 MG CAPS, Take 125 mg by mouth daily.   Riboflavin (VITAMIN B-2) 25 MG TABS, Take 25 mg by mouth daily.   thiamine (VITAMIN B-1) 50 MG tablet, Take 25 mg by mouth daily.   Ubiquinol 100 MG CAPS, Take 100 mg by mouth daily.   vitamin A 10000 UNIT capsule, Take 10,000 Units by mouth daily.   vitamin B-6 (PYRIDOXINE) 25 MG tablet, Take 25 mg by mouth daily.   vitamin C (ASCORBIC ACID) 500 MG tablet, Take 500 mg by mouth daily.   Reviewed prior external information including notes and imaging from  primary care provider As well as notes that were available from care everywhere and other healthcare systems.  Past medical history, social, surgical and family history all reviewed in electronic medical record.  No pertanent information unless stated regarding to the chief complaint.   Review of Systems:  No headache, visual changes, nausea, vomiting, diarrhea, constipation, dizziness, abdominal pain, skin rash, fevers, chills, night sweats, weight loss, swollen lymph nodes, body aches, joint swelling, chest pain, shortness of breath, mood changes. POSITIVE muscle aches  Objective  Blood pressure 118/82, pulse 95, height 5\' 3"  (1.6 m), weight 226 lb (102.5 kg), SpO2 97 %.   General: No apparent distress alert and oriented x3 mood and affect normal, dressed appropriately.  HEENT: Pupils equal, extraocular movements intact  Respiratory: Patient's speak in full sentences and does not appear short of breath  Cardiovascular: No lower extremity edema, non tender, no erythema  Right hip exam has good range of motion noted.  Good internal and external range of motion.  Some mild tightness with FABER test right greater than left.   Limited muscular skeletal ultrasound was performed and  interpreted by Hulan Saas, M  Limited ultrasound of patient's right hip shows that there is some mild hypoechoic changes underneath the labrum.  Do not see a true tear but difficult to assess secondary to patient body habitus.  Patient does have some calcific changes noted of the superficial musculature on the anterior lateral aspect of the hip that could potentially cause some possible impingement noted.  No abnormal blood flow in the area. 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  This included exercises focusing on stretching, strengthening, with significant focus on eccentric aspects.   Long term goals include an improvement in range of motion, strength, endurance as well as avoiding reinjury. Patient's frequency would include in 1-2 times a day, 3-5 times a week for a duration of 6-12 weeks. Hip strengthening exercises which included:  Pelvic tilt/bracing to help with proper recruitment of the lower abs and pelvic floor muscles  Glute strengthening to properly contract glutes without over-engaging low back and hamstrings - prone hip extension and glute bridge exercises Proper stretching techniques to increase effectiveness for the hip flexors, groin, quads, piriformic and low back when appropriate   Proper technique shown and discussed handout in great detail with ATC.  All questions were discussed and answered.     Impression and Recommendations:    The above documentation has been reviewed and is accurate and complete Lyndal Pulley, DO

## 2022-04-18 NOTE — Patient Instructions (Signed)
Labs today Exercises Ice after a lot of activity See me in 6 weeks

## 2022-04-18 NOTE — Assessment & Plan Note (Signed)
Patient on ultrasound does have some calcific changes noted of the soft tissues in the area of the tensor fascia lata that could be potentially giving patient any impingement of the hip.  We discussed with patient about icing regimen and given home exercises that I think will be beneficial.  Likely the x-ray does not show any significant arthritic changes noted.  Ultrasound does show that there is a potential for labral pathology as well.  Worsening symptoms due to the calcific changes I do think we need to get labs to further evaluate.  Follow-up with me again in 6 weeks after doing the home exercises and continue territories given by primary care provider.

## 2022-04-19 LAB — PTH, INTACT AND CALCIUM
Calcium: 10 mg/dL (ref 8.6–10.3)
PTH: 49 pg/mL (ref 16–77)

## 2022-04-19 LAB — CALCIUM, IONIZED: Calcium, Ion: 4.9 mg/dL (ref 4.7–5.5)

## 2022-05-02 ENCOUNTER — Ambulatory Visit: Payer: Medicare PPO

## 2022-05-15 ENCOUNTER — Ambulatory Visit (INDEPENDENT_AMBULATORY_CARE_PROVIDER_SITE_OTHER): Payer: Medicare PPO

## 2022-05-15 VITALS — Ht 63.0 in | Wt 226.0 lb

## 2022-05-15 DIAGNOSIS — Z0000 Encounter for general adult medical examination without abnormal findings: Secondary | ICD-10-CM | POA: Diagnosis not present

## 2022-05-15 NOTE — Progress Notes (Signed)
Virtual Visit via Telephone Note  I connected with  William Dodson on 05/15/22 at 11:45 AM EST by telephone and verified that I am speaking with the correct person using two identifiers.  Location: Patient: Home/Office Provider: [facility] Persons participating in the virtual visit: patient/Nurse Health Advisor   I discussed the limitations, risks, security and privacy concerns of performing an evaluation and management service by telephone and the availability of in person appointments. The patient expressed understanding and agreed to proceed.  Interactive audio and video telecommunications were attempted between this nurse and patient, however failed, due to patient having technical difficulties OR patient did not have access to video capability.  We continued and completed visit with audio only.  Some vital signs may be absent or patient reported.   Mickeal Needy, LPN  Subjective:   William Dodson is a 72 y.o. male who presents for an Initial Medicare Annual Wellness Visit.  Review of Systems     Cardiac Risk Factors include: advanced age (>47men, >65 women)     Objective:    Today's Vitals   05/15/22 1147  Weight: 226 lb (102.5 kg)  Height: 5\' 3"  (1.6 m)  PainSc: 0-No pain   Body mass index is 40.03 kg/m.     05/15/2022   11:51 AM 08/24/2016    6:49 AM 07/07/2016    4:48 PM 07/22/2015    4:35 PM  Advanced Directives  Does Patient Have a Medical Advance Directive? Yes Yes No No;Yes  Type of 07/24/2015 of [place];Living will Healthcare Power of [place];Living will  Healthcare Power of Attorney  Does patient want to make changes to medical advance directive?  No - Patient declined    Copy of Healthcare Power of Attorney in Chart? No - copy requested No - copy requested      Current Medications (verified) Outpatient Encounter Medications as of 05/15/2022  Medication Sig   b complex vitamins tablet Take 1 tablet by mouth  daily.   BIOTIN PO Take 50 mcg by mouth daily.   Cholecalciferol (VITAMIN D3) 5000 units CAPS Take 5,000 Units by mouth daily.   CHOLINE PO Take 20 mg by mouth daily.   Chromium Picolinate (CHROMIUM PICOLATE PO) Take 200 mcg by mouth daily.   Folic Acid (FOLATE PO) Take 200 mcg by mouth daily.   ibuprofen (ADVIL,MOTRIN) 200 MG tablet Take 200-400 mg by mouth every 8 (eight) hours as needed for moderate pain.   INOSITOL PO Take 50 mg by mouth daily.   Krill Oil 1000 MG CAPS Take 1,000 mg by mouth daily.   MAGNESIUM PO Take 500 mg by mouth daily.   meloxicam (MOBIC) 15 MG tablet Take 1 tablet (15 mg total) by mouth daily.   Milk Thistle 175 MG CAPS Take 175 mg by mouth daily.   Misc Natural Products (SAW PALMETTO) CAPS Take 500 mg by mouth daily.   Niacin (VITAMIN B-3 PO) Take 50 mg by mouth daily.   P-Aminobenzoic Acid (PABA) 100 MG TABS Take 25 mg by mouth daily.   Pantothenic Acid 250 MG CAPS Take 125 mg by mouth daily.   Riboflavin (VITAMIN B-2) 25 MG TABS Take 25 mg by mouth daily.   thiamine (VITAMIN B-1) 50 MG tablet Take 25 mg by mouth daily.   Ubiquinol 100 MG CAPS Take 100 mg by mouth daily.   vitamin A 05/17/2022 UNIT capsule Take 10,000 Units by mouth daily.   vitamin B-12 (CYANOCOBALAMIN) 250 MCG tablet Take  125 mcg by mouth daily.   vitamin B-6 (PYRIDOXINE) 25 MG tablet Take 25 mg by mouth daily.   vitamin C (ASCORBIC ACID) 500 MG tablet Take 500 mg by mouth daily.   No facility-administered encounter medications on file as of 05/15/2022.    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[facility])    "heart infection" in setting of pneumonia 1993   Sleep apnea    recent sleep study, results pending   Past Surgical History:  Procedure Laterality Date   ATRIAL  ABLATION SURGERY  1999   afib ablation 1990s at [facility] by [place] N/A 08/24/2016   Procedure: [place];  Surgeon: Clovis Riley, MD;  Location: [facility];  Service: General;  Laterality: N/A;  Laparoscopic Repair of Umbilical and Supraumbilical Hernia with Mesh    Family History  Problem Relation Age of Onset   Diabetes Mother    Cancer Mother    Mental illness Mother    Heart disease Father    Hyperlipidemia Father    Mental illness Brother    Diabetes Maternal Grandmother    Heart disease Paternal Grandmother    Social History   Socioeconomic History   Marital status: Married    Spouse name: Not on file   Number of children: Not on file   Years of education: Not on file   Highest education level: Not on file  Occupational History   Occupation: Development worker, community  Tobacco Use   Smoking status: Never   Smokeless tobacco: Never   Tobacco comments:    tried smoking 30 years ago  Vaping Use   Vaping Use: Never used  Substance and Sexual Activity   Alcohol use: Yes    Alcohol/week: 6.0 standard drinks of alcohol    Types: 6 Glasses of wine per week    Comment: 1 glass of wine at night   Drug use: No   Sexual activity: Not on file  Other Topics Concern   Not on file  Social History Narrative   Married   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[employer]   Two children also pursuing Art   Sedentary      Sculptor, primarily with iron   Social Determinants of Health   Financial Resource Strain: Low Risk  (05/15/2022)   Overall Financial Resource Strain (CARDIA)    Difficulty of Paying Living Expenses: Not hard at all  Food Insecurity: No Food Insecurity (05/15/2022)   Hunger Vital Sign    Worried About Running Out of Food in the Last Year: Never true    [place] in the Last Year: Never true  Transportation Needs: No Transportation Needs (05/15/2022)   PRAPARE - Hydrologist (Medical): No    Lack of  Transportation (Non-Medical): No  Physical Activity: Sufficiently Active (05/15/2022)   Exercise Vital Sign    Days of Exercise per Week: 7 days    Minutes of Exercise per Session: 30 min  Stress: No Stress Concern Present (05/15/2022)   [place]    Feeling of Stress : Not at all  Social Connections: [place] (05/15/2022)   Social Connection and Isolation Panel [NHANES]    Frequency of  Communication with Friends and Family: More than three times a week    Frequency of Social Gatherings with Friends and Family: More than three times a week    Attends Religious Services: More than 4 times per year    Active Member of [employer] or Organizations: Yes    Attends Engineer, structural: More than 4 times per year    Marital Status: Married    Tobacco Counseling Counseling given: Not Answered Tobacco comments: tried smoking 30 years ago   Clinical Intake:  Pre-visit preparation completed: Yes  Pain : No/denies pain Pain Score: 0-No pain     BMI - recorded: 40.03 Nutritional Status: BMI > 30  Obese Nutritional Risks: None Diabetes: No  How often do you need to have someone help you when you read instructions, pamphlets, or other written materials from your doctor or pharmacy?: 1 - Never What is the last grade level you completed in school?: College  Diabetic? No  Interpreter Needed?: No  Information entered by :: Susie Cassette, LPN.   Activities of Daily Living    05/15/2022   12:08 PM  In your present state of health, do you have any difficulty performing the following activities:  Hearing? 0  Vision? 0  Difficulty concentrating or making decisions? 0  Walking or climbing stairs? 0  Dressing or bathing? 0  Doing errands, shopping? 0  Preparing Food and eating ? N  Using the Toilet? N  In the past six months, have you accidently leaked urine? N  Do you have problems with loss of bowel  control? N  Managing your Medications? N  Managing your Finances? N  Housekeeping or managing your Housekeeping? N    Patient Care Team: Georgina Quint, MD as PCP - General (Internal Medicine) Regan Lemming, MD as PCP - Cardiology (Cardiology) Rodrigo Ran, OD as Consulting Physician (Ophthalmology)  Indicate any recent Medical Services you may have received from other than [facility] providers in the past year (date may be approximate).     Assessment:   This is a routine wellness examination for William Dodson.  Hearing/Vision screen Hearing Screening - Comments:: Denies hearing difficulties   Vision Screening - Comments:: Wears rx glasses - up to date with routine eye exams with    Dietary issues and exercise activities discussed: Current Exercise Habits: Home exercise routine, Type of exercise: walking, Time (Minutes): 30, Frequency (Times/Week): 7, Weekly Exercise (Minutes/Week): 210, Intensity: Moderate, Exercise limited by: orthopedic condition(s)   Goals Addressed             This Visit's Progress    Client understands the importance of follow-up with providers by attending scheduled visits        Depression Screen    05/15/2022   11:54 AM 04/17/2022    8:52 AM 01/26/2021    2:31 PM 01/19/2021    3:26 PM 02/19/2019    4:17 PM 11/11/2018    3:49 PM 06/13/2018    5:10 PM  PHQ 2/9 Scores  PHQ - 2 Score 0 0 0 0 0 0 0    Fall Risk    05/15/2022   11:52 AM 04/17/2022    8:51 AM 01/26/2021    2:31 PM 01/19/2021    3:26 PM 02/19/2019    4:17 PM  Fall Risk   Falls in the past year? 0 0 0 0 0  Number falls in past yr: 0 0 0 0   Injury with Fall? 0 0 0 0  Risk for fall due to : No Fall Risks No Fall Risks     Follow up Falls prevention discussed Falls evaluation completed   Falls evaluation completed    FALL RISK PREVENTION PERTAINING TO THE HOME:  Any stairs in or around the home? Yes  If so, are there any without handrails? No  Home free of loose throw  rugs in walkways, pet beds, electrical cords, etc? Yes  Adequate lighting in your home to reduce risk of falls? Yes   ASSISTIVE DEVICES UTILIZED TO PREVENT FALLS:  Life alert? No  Use of a cane, walker or w/c? No  Grab bars in the bathroom? Yes  Shower chair or bench in shower? No  Elevated toilet seat or a handicapped toilet? No   TIMED UP AND GO:  Was the test performed? No . Phone Visit  Cognitive Function:        05/15/2022   12:09 PM  6CIT Screen  What Year? 0 points  What month? 0 points  What time? 0 points  Count back from 20 0 points  Months in reverse 0 points  Repeat phrase 0 points  Total Score 0 points    Immunizations Immunization History  Administered Date(s) Administered   COVID-19, mRNA, vaccine(Comirnaty)12 years and older 04/26/2022   Fluad Quad(high Dose 65+) 04/17/2022   Influenza-Unspecified 04/17/2022   PFIZER(Purple Top)SARS-COV-2 Vaccination 07/02/2019, 07/28/2019, 02/08/2020, 08/30/2020, 02/13/2021   PNEUMOCOCCAL CONJUGATE-20 04/17/2022    TDAP status: Due, Education has been provided regarding the importance of this vaccine. Advised may receive this vaccine at local pharmacy or Health Dept. Aware to provide a copy of the vaccination record if obtained from local pharmacy or Health Dept. Verbalized acceptance and understanding.  Flu Vaccine status: Up to date  Pneumococcal vaccine status: Up to date  Covid-19 vaccine status: Completed vaccines  Qualifies for Shingles Vaccine? Yes   Zostavax completed No   Shingrix Completed?: No.    Education has been provided regarding the importance of this vaccine. Patient has been advised to call insurance company to determine out of pocket expense if they have not yet received this vaccine. Advised may also receive vaccine at local pharmacy or Health Dept. Verbalized acceptance and understanding.  Screening Tests Health Maintenance  Topic Date Due   Hepatitis C Screening  Never done   Zoster  Vaccines- Shingrix (1 of 2) 12/17/2022 (Originally 10/27/1969)   COVID-19 Vaccine (7 - 2023-24 season) 06/21/2022   Medicare Annual Wellness (AWV)  05/16/2023   COLONOSCOPY (Pts 45-47yrs Insurance coverage will need to be confirmed)  05/01/2027   Pneumonia Vaccine 61+ Years old  Completed   INFLUENZA VACCINE  Completed   HPV VACCINES  Aged Out   DTaP/Tdap/Td  Discontinued    Health Maintenance  Health Maintenance Due  Topic Date Due   Hepatitis C Screening  Never done    Colorectal cancer screening: Type of screening: Colonoscopy. Completed 04/30/2017. Repeat every 10 years  Lung Cancer Screening: (Low Dose CT Chest recommended if Age 34-80 years, 30 pack-year currently smoking OR have quit w/in 15years.) does not qualify.   Lung Cancer Screening Referral: no  Additional Screening:  Hepatitis C Screening: does qualify; Completed no  Vision Screening: Recommended annual ophthalmology exams for early detection of glaucoma and other disorders of the eye. Is the patient up to date with their annual eye exam?  Yes  Who is the provider or what is the name of the office in which the patient attends annual eye exams? Marylene Land  Martinek, OD. If pt is not established with a provider, would they like to be referred to a provider to establish care? No .   Dental Screening: Recommended annual dental exams for proper oral hygiene  Community Resource Referral / Chronic Care Management: CRR required this visit?  No   CCM required this visit?  No      Plan:     I have personally reviewed and noted the following in the patient's chart:   Medical and social history Use of alcohol, tobacco or illicit drugs  Current medications and supplements including opioid prescriptions. Patient is not currently taking opioid prescriptions. Functional ability and status Nutritional status Physical activity Advanced directives List of other physicians Hospitalizations, surgeries, and ER visits in  previous 12 months Vitals Screenings to include cognitive, depression, and falls Referrals and appointments  In addition, I have reviewed and discussed with patient certain preventive protocols, quality metrics, and best practice recommendations. A written personalized care plan for preventive services as well as general preventive health recommendations were provided to patient.     Sheral Flow, LPN   3/38/2505   Nurse Notes: N/A

## 2022-05-15 NOTE — Patient Instructions (Signed)
William Dodson , Thank you for taking time to come for your Medicare Wellness Visit. I appreciate your ongoing commitment to your health goals. Please review the following plan we discussed and let me know if I can assist you in the future.   These are the goals we discussed:  Goals      Client understands the importance of follow-up with providers by attending scheduled visits        This is a list of the screening recommended for you and due dates:  Health Maintenance  Topic Date Due   Hepatitis C Screening: USPSTF Recommendation to screen - Ages 1-79 yo.  Never done   Zoster (Shingles) Vaccine (1 of 2) 12/17/2022*   COVID-19 Vaccine (7 - 2023-24 season) 06/21/2022   Medicare Annual Wellness Visit  05/16/2023   Colon Cancer Screening  05/01/2027   Pneumonia Vaccine  Completed   Flu Shot  Completed   HPV Vaccine  Aged Out   DTaP/Tdap/Td vaccine  Discontinued  *Topic was postponed. The date shown is not the original due date.    Advanced directives: Yes  Conditions/risks identified: Yes  Next appointment: Follow up in one year for your annual wellness visit.   Preventive Care 24 Years and Older, Male  Preventive care refers to lifestyle choices and visits with your health care provider that can promote health and wellness. What does preventive care include? A yearly physical exam. This is also called an annual well check. Dental exams once or twice a year. Routine eye exams. Ask your health care provider how often you should have your eyes checked. Personal lifestyle choices, including: Daily care of your teeth and gums. Regular physical activity. Eating a healthy diet. Avoiding tobacco and drug use. Limiting alcohol use. Practicing safe sex. Taking low doses of aspirin every day. Taking vitamin and mineral supplements as recommended by your health care provider. What happens during an annual well check? The services and screenings done by your health care provider  during your annual well check will depend on your age, overall health, lifestyle risk factors, and family history of disease. Counseling  Your health care provider may ask you questions about your: Alcohol use. Tobacco use. Drug use. Emotional well-being. Home and relationship well-being. Sexual activity. Eating habits. History of falls. Memory and ability to understand (cognition). Work and work Statistician. Screening  You may have the following tests or measurements: Height, weight, and BMI. Blood pressure. Lipid and cholesterol levels. These may be checked every 5 years, or more frequently if you are over 54 years old. Skin check. Lung cancer screening. You may have this screening every year starting at age 109 if you have a 30-pack-year history of smoking and currently smoke or have quit within the past 15 years. Fecal occult blood test (FOBT) of the stool. You may have this test every year starting at age 3. Flexible sigmoidoscopy or colonoscopy. You may have a sigmoidoscopy every 5 years or a colonoscopy every 10 years starting at age 62. Prostate cancer screening. Recommendations will vary depending on your family history and other risks. Hepatitis C blood test. Hepatitis B blood test. Sexually transmitted disease (STD) testing. Diabetes screening. This is done by checking your blood sugar (glucose) after you have not eaten for a while (fasting). You may have this done every 1-3 years. Abdominal aortic aneurysm (AAA) screening. You may need this if you are a current or former smoker. Osteoporosis. You may be screened starting at age 16 if you are  at high risk. Talk with your health care provider about your test results, treatment options, and if necessary, the need for more tests. Vaccines  Your health care provider may recommend certain vaccines, such as: Influenza vaccine. This is recommended every year. Tetanus, diphtheria, and acellular pertussis (Tdap, Td) vaccine. You  may need a Td booster every 10 years. Zoster vaccine. You may need this after age 64. Pneumococcal 13-valent conjugate (PCV13) vaccine. One dose is recommended after age 70. Pneumococcal polysaccharide (PPSV23) vaccine. One dose is recommended after age 3. Talk to your health care provider about which screenings and vaccines you need and how often you need them. This information is not intended to replace advice given to you by your health care provider. Make sure you discuss any questions you have with your health care provider. Document Released: 05/13/2015 Document Revised: 01/04/2016 Document Reviewed: 02/15/2015 Elsevier Interactive Patient Education  2017 [place] Prevention in the Home Falls can cause injuries. They can happen to people of all ages. There are many things you can do to make your home safe and to help prevent falls. What can I do on the outside of my home? Regularly fix the edges of walkways and driveways and fix any cracks. Remove anything that might make you trip as you walk through a door, such as a raised step or threshold. Trim any bushes or trees on the path to your home. Use bright outdoor lighting. Clear any walking paths of anything that might make someone trip, such as rocks or tools. Regularly check to see if handrails are loose or broken. Make sure that both sides of any steps have handrails. Any raised decks and porches should have guardrails on the edges. Have any leaves, snow, or ice cleared regularly. Use sand or salt on walking paths during winter. Clean up any spills in your garage right away. This includes oil or grease spills. What can I do in the bathroom? Use night lights. Install grab bars by the toilet and in the tub and shower. Do not use towel bars as grab bars. Use non-skid mats or decals in the tub or shower. If you need to sit down in the shower, use a plastic, non-slip stool. Keep the floor dry. Clean up any water that spills  on the floor as soon as it happens. Remove soap buildup in the tub or shower regularly. Attach bath mats securely with double-sided non-slip rug tape. Do not have throw rugs and other things on the floor that can make you trip. What can I do in the bedroom? Use night lights. Make sure that you have a light by your bed that is easy to reach. Do not use any sheets or blankets that are too big for your bed. They should not hang down onto the floor. Have a firm chair that has side arms. You can use this for support while you get dressed. Do not have throw rugs and other things on the floor that can make you trip. What can I do in the kitchen? Clean up any spills right away. Avoid walking on wet floors. Keep items that you use a lot in easy-to-reach places. If you need to reach something above you, use a strong step stool that has a grab bar. Keep electrical cords out of the way. Do not use floor polish or wax that makes floors slippery. If you must use wax, use non-skid floor wax. Do not have throw rugs and other things on the floor  that can make you trip. What can I do with my stairs? Do not leave any items on the stairs. Make sure that there are handrails on both sides of the stairs and use them. Fix handrails that are broken or loose. Make sure that handrails are as long as the stairways. Check any carpeting to make sure that it is firmly attached to the stairs. Fix any carpet that is loose or worn. Avoid having throw rugs at the top or bottom of the stairs. If you do have throw rugs, attach them to the floor with carpet tape. Make sure that you have a light switch at the top of the stairs and the bottom of the stairs. If you do not have them, ask someone to add them for you. What else can I do to help prevent falls? Wear shoes that: Do not have high heels. Have rubber bottoms. Are comfortable and fit you well. Are closed at the toe. Do not wear sandals. If you use a stepladder: Make  sure that it is fully opened. Do not climb a closed stepladder. Make sure that both sides of the stepladder are locked into place. Ask someone to hold it for you, if possible. Clearly mark and make sure that you can see: Any grab bars or handrails. First and last steps. Where the edge of each step is. Use tools that help you move around (mobility aids) if they are needed. These include: Canes. Walkers. Scooters. Crutches. Turn on the lights when you go into a dark area. Replace any light bulbs as soon as they burn out. Set up your furniture so you have a clear path. Avoid moving your furniture around. If any of your floors are uneven, fix them. If there are any pets around you, be aware of where they are. Review your medicines with your doctor. Some medicines can make you feel dizzy. This can increase your chance of falling. Ask your doctor what other things that you can do to help prevent falls. This information is not intended to replace advice given to you by your health care provider. Make sure you discuss any questions you have with your health care provider. Document Released: 02/10/2009 Document Revised: 09/22/2015 Document Reviewed: 05/21/2014 Elsevier Interactive Patient Education  2017 [employer].

## 2022-05-22 IMAGING — DX DG CHEST 2V
2 series · 2 of 2 positions shown · non-contrast
Comparison: June 13, 2018.

CLINICAL DATA: Pneumonia.

EXAM:
CHEST - 2 VIEW

[chest pa]
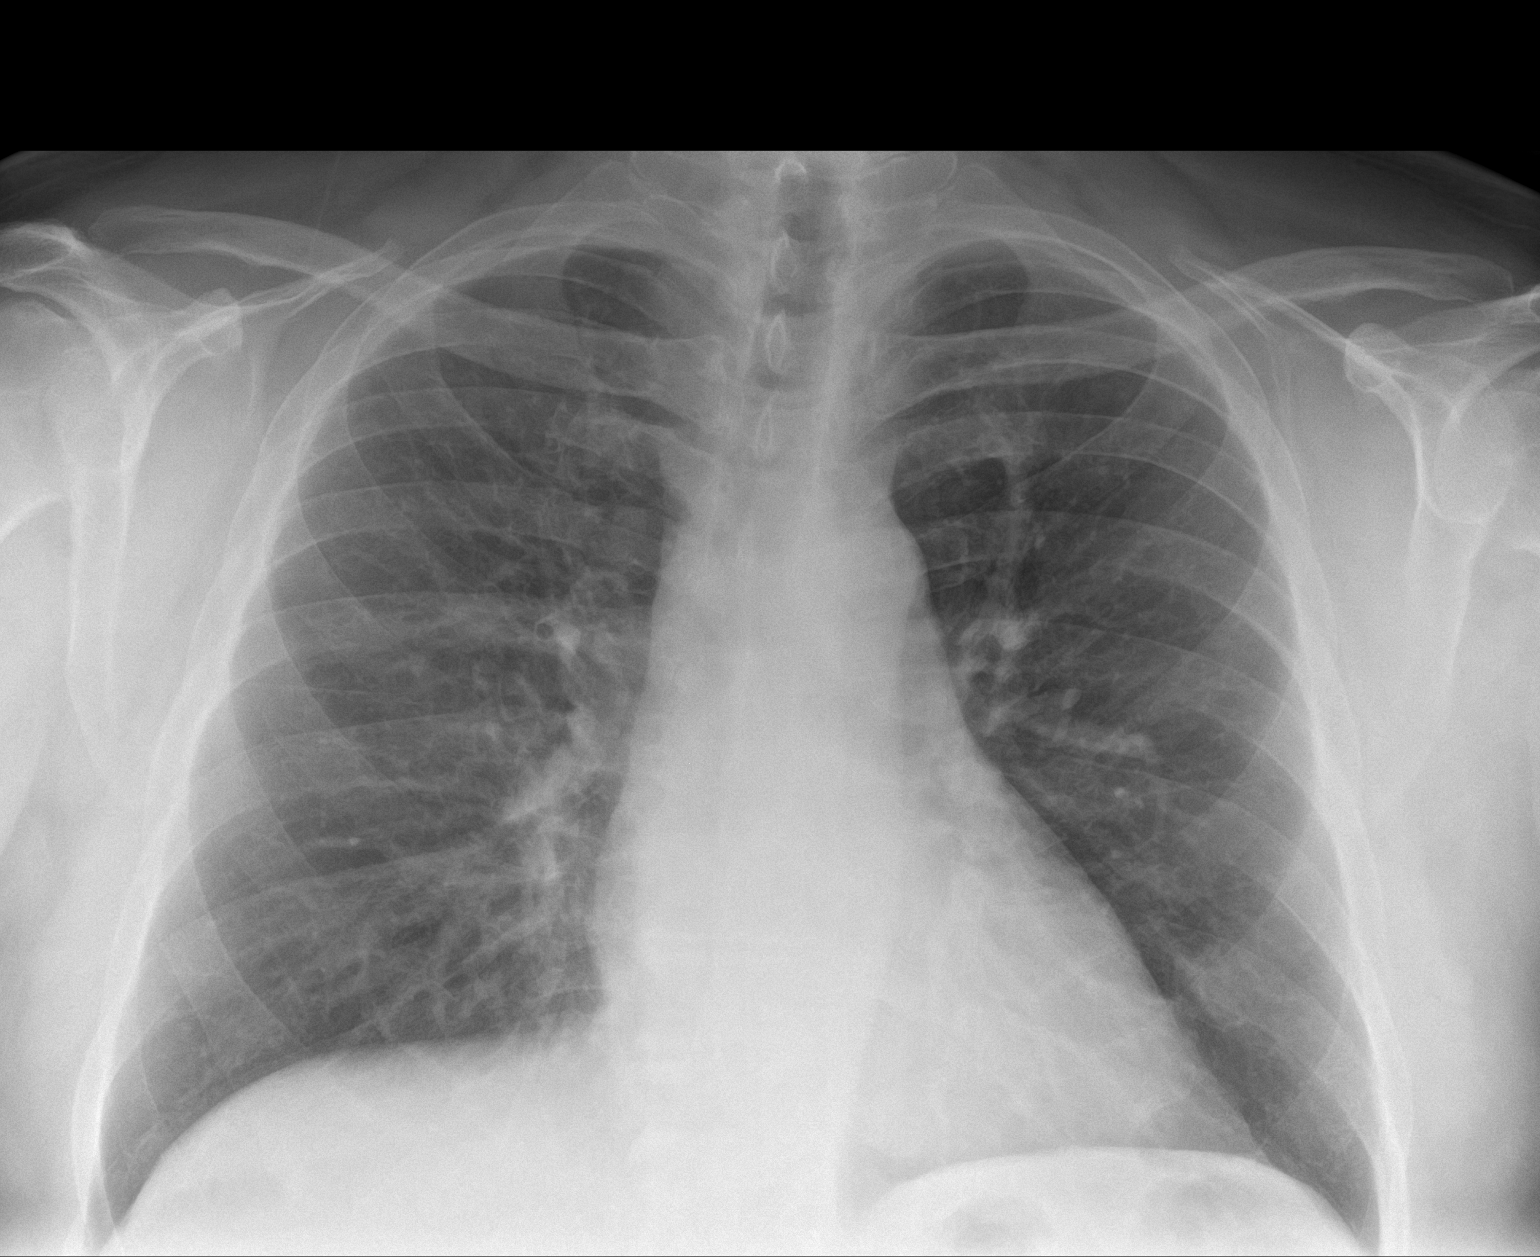

[chest lat]
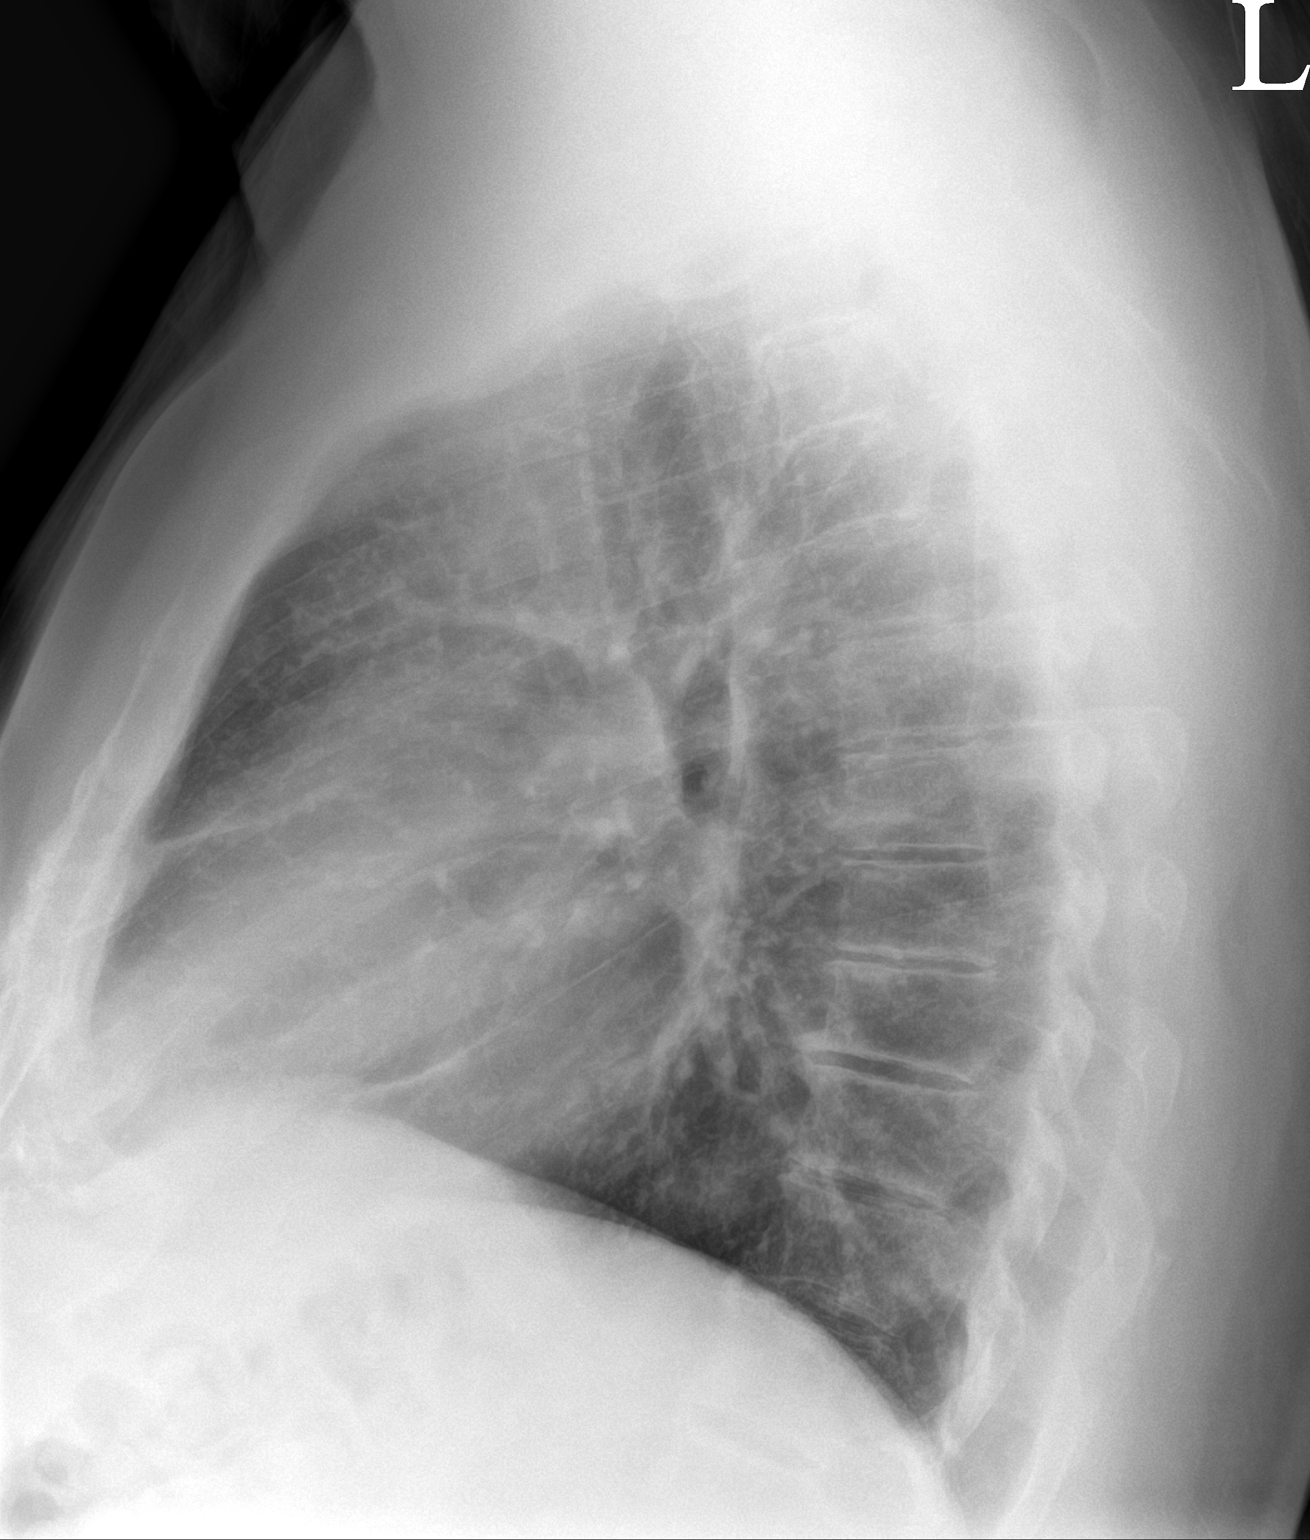

[2 of 2 positions shown; findings below may reference images not displayed]

FINDINGS: The heart size and mediastinal contours are within normal limits.
Both lungs are clear. The visualized skeletal structures are
unremarkable.
IMPRESSION: No active cardiopulmonary disease.

## 2022-05-29 NOTE — Progress Notes (Unsigned)
[place] Phone: [phone] Subjective:   William Dodson, am serving as a scribe for Dr. Hulan Saas.  I'm seeing this patient by the request  of:  Sagardia, Miguel Jose, MD  CC: Right hip and abdominal pain  [id number]  04/18/2022 Patient on ultrasound does have some calcific changes noted of the soft tissues in the area of the tensor fascia lata that could be potentially giving patient any impingement of the hip.  We discussed with patient about icing regimen and given home exercises that I think will be beneficial.  Likely the x-ray does not show any significant arthritic changes noted.  Ultrasound does show that there is a potential for labral pathology as well.  Worsening symptoms due to the calcific changes I do think we need to get labs to further evaluate.  Follow-up with me again in 6 weeks after doing the home exercises and continue territories given by primary care provider.   Update 05/29/2022 William Dodson is a 72 y.o. male coming in with complaint of R hip pain. Patient states that his pain is in [place]. Asks if he has appendicitis due to his pain moving more superior. Pain will be there one day and then not the next day. Little to Dodson pain below ASIS. Painful at night to lie on his side. Patient ran out of meloxicam.        Past Medical History:  Diagnosis Date   Atrial fibrillation ([place])    persistent,  s/p PVI by Dr Rolland Porter at [place]    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[facility])    "heart infection" in setting of pneumonia 1993   Sleep apnea    recent sleep study, results pending   Past Surgical History:  Procedure Laterality Date   [place]   afib ablation 1990s at [place] by Damon N/A 08/24/2016   Procedure: LAPAROSCOPIC VENTRAL HERNIA REPAIR WITH MESH;  Surgeon: Clovis Riley, MD;  Location: [place];  Service: General;  Laterality: N/A;   Laparoscopic Repair of Umbilical and Supraumbilical Hernia with Mesh    Social History   Socioeconomic History   Marital status: Married    Spouse name: Not on file   Number of children: Not on file   Years of education: Not on file   Highest education level: Not on file  Occupational History   Occupation: Sculptor  Tobacco Use   Smoking status: Never   Smokeless tobacco: Never   Tobacco comments:    tried smoking 30 years ago  Vaping Use   Vaping Use: Never used  Substance and Sexual Activity   Alcohol use: Yes    Alcohol/week: 6.0 standard drinks of alcohol    Types: 6 Glasses of wine per week    Comment: 1 glass of wine at night   Drug use: Dodson   Sexual activity: Not on file  Other Topics Concern   Not on file  Social History Narrative   Married   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[employer]   Two children also pursuing Art   Sedentary      Sculptor, primarily with iron   Social Determinants of Health   Financial Resource Strain: Low Risk  (05/15/2022)   Overall Financial Resource Strain (CARDIA)    Difficulty of Paying Living Expenses: Not hard at all  Food Insecurity: Dodson Food Insecurity (05/15/2022)   Hunger Vital Sign  Worried About Charity fundraiser in the Last Year: Never true    [place] in the Last Year: Never true  Transportation Needs: Dodson Transportation Needs (05/15/2022)   PRAPARE - Hydrologist (Medical): Dodson    Lack of Transportation (Non-Medical): Dodson  Physical Activity: Sufficiently Active (05/15/2022)   Exercise Vital Sign    Days of Exercise per Week: 7 days    Minutes of Exercise per Session: 30 min  Stress: Dodson Stress Concern Present (05/15/2022)   [place]    Feeling of Stress : Not at all  Social Connections: [place] (05/15/2022)   Social Connection and Isolation Panel [NHANES]    Frequency of Communication with Friends and Family: More than three  times a week    Frequency of Social Gatherings with Friends and Family: More than three times a week    Attends Religious Services: More than 4 times per year    Active Member of [employer] or Organizations: Yes    Attends Music therapist: More than 4 times per year    Marital Status: Married   Allergies  Allergen Reactions   Morphine And Related Other (See Comments)    Increased hearing, hallucinations, weird feeling   Penicillins    Doxycycline Itching and Rash        Hydrocodone Itching and Rash   Septra [Bactrim] Itching and Rash        Family History  Problem Relation Age of Onset   Diabetes Mother    Cancer Mother    Mental illness Mother    Heart disease Father    Hyperlipidemia Father    Mental illness Brother    Diabetes Maternal Grandmother    Heart disease Paternal Grandmother        Current Outpatient Medications (Analgesics):    ibuprofen (ADVIL,MOTRIN) 200 MG tablet, Take 200-400 mg by mouth every 8 (eight) hours as needed for moderate pain.   meloxicam (MOBIC) 15 MG tablet, Take 1 tablet (15 mg total) by mouth daily.  Current Outpatient Medications (Hematological):    Folic Acid (FOLATE PO), Take 200 mcg by mouth daily.   vitamin B-12 (CYANOCOBALAMIN) 250 MCG tablet, Take 125 mcg by mouth daily.  Current Outpatient Medications (Other):    b complex vitamins tablet, Take 1 tablet by mouth daily.   BIOTIN PO, Take 50 mcg by mouth daily.   Cholecalciferol (VITAMIN D3) 5000 units CAPS, Take 5,000 Units by mouth daily.   CHOLINE PO, Take 20 mg by mouth daily.   Chromium Picolinate (CHROMIUM PICOLATE PO), Take 200 mcg by mouth daily.   INOSITOL PO, Take 50 mg by mouth daily.   Krill Oil 1000 MG CAPS, Take 1,000 mg by mouth daily.   MAGNESIUM PO, Take 500 mg by mouth daily.   Milk Thistle 175 MG CAPS, Take 175 mg by mouth daily.   Misc Natural Products (SAW PALMETTO) CAPS, Take 500 mg by mouth daily.   Niacin (VITAMIN B-3 PO), Take 50 mg by mouth  daily.   P-Aminobenzoic Acid (PABA) 100 MG TABS, Take 25 mg by mouth daily.   Pantothenic Acid 250 MG CAPS, Take 125 mg by mouth daily.   Riboflavin (VITAMIN B-2) 25 MG TABS, Take 25 mg by mouth daily.   thiamine (VITAMIN B-1) 50 MG tablet, Take 25 mg by mouth daily.   Ubiquinol 100 MG CAPS, Take 100 mg by mouth daily.  vitamin A 10000 UNIT capsule, Take 10,000 Units by mouth daily.   vitamin B-6 (PYRIDOXINE) 25 MG tablet, Take 25 mg by mouth daily.   vitamin C (ASCORBIC ACID) 500 MG tablet, Take 500 mg by mouth daily.   Reviewed prior external information including notes and imaging from  primary care provider As well as notes that were available from care everywhere and other healthcare systems.  Past medical history, social, surgical and family history all reviewed in electronic medical record.  Dodson pertanent information unless stated regarding to the chief complaint.   Review of Systems:  Dodson headache, visual changes, nausea, vomiting, diarrhea, constipation, dizziness,  skin rash, fevers, chills, night sweats, weight loss, swollen lymph nodes, body aches, joint swelling, chest pain, shortness of breath, mood changes. POSITIVE muscle aches, abdominal pain  Objective  Blood pressure 126/76, pulse 61, height 5\' 3"  (1.6 m), weight 231 lb (104.8 kg), SpO2 98 %.   General: Dodson apparent distress alert and oriented x3 mood and affect normal, dressed appropriately.  HEENT: Pupils equal, extraocular movements intact  Respiratory: Patient's speak in full sentences and does not appear short of breath  Cardiovascular: Dodson lower extremity edema, non tender, Dodson erythema  Right hip does have improvement in range of motion.  Still has some limited internal range of motion noted.  Some tightness of the hip flexor.  More severe tenderness in the right lower quadrant in the right flank area.  Negative CVA tenderness though noted.  Dodson masses appreciated.  Mild positive McMurray's noted.    Impression and  Recommendations:    The above documentation has been reviewed and is accurate and complete Lyndal Pulley, DO

## 2022-05-30 ENCOUNTER — Ambulatory Visit: Payer: Medicare PPO | Admitting: Family Medicine

## 2022-05-30 ENCOUNTER — Ambulatory Visit: Payer: Self-pay

## 2022-05-30 VITALS — BP 126/76 | HR 61 | Ht 63.0 in | Wt 231.0 lb

## 2022-05-30 DIAGNOSIS — R1031 Right lower quadrant pain: Secondary | ICD-10-CM | POA: Diagnosis not present

## 2022-05-30 DIAGNOSIS — M25551 Pain in right hip: Secondary | ICD-10-CM | POA: Diagnosis not present

## 2022-05-30 NOTE — Assessment & Plan Note (Addendum)
No longer seems to be more of the hip and the tensor fascia lata.  Seems to be more in the abdominal area. Do not think that further imaging of the hip is necessary at this time.  Do feel a CT abdomen with and without contrast would be more beneficial.

## 2022-05-30 NOTE — Assessment & Plan Note (Addendum)
Pain seems to be worse in the right lower quadrant.  Patient states that it is kind of getting worse than anything else.  Does not seem to be more in the appendix area but seems more somewhat flank pain.  Really difficult to assess.  Patient is very concerned and is still the primary caregiver for his wife.  Discussed icing regimen and home exercises.  Discussed which activities to do and which ones to avoid.   patient knows of worsening discomfort to seek medical attention or go to the emergency room.

## 2022-05-30 NOTE — Patient Instructions (Signed)
CT Abdomen and Pelvis w and wo [phone]

## 2022-06-15 ENCOUNTER — Ambulatory Visit
Admission: RE | Admit: 2022-06-15 | Discharge: 2022-06-15 | Disposition: A | Discharge status: Home / Self Care | Payer: Medicare PPO | Source: Ambulatory Visit | Attending: Family Medicine | Admitting: Family Medicine

## 2022-06-15 DIAGNOSIS — R1031 Right lower quadrant pain: Secondary | ICD-10-CM

## 2022-06-15 MED ORDER — IOPAMIDOL (ISOVUE-300) INJECTION 61%
100.0000 mL | Freq: Once | INTRAVENOUS | Status: AC | PRN
  Administered 2022-06-15: 100 mL via INTRAVENOUS

## 2022-07-21 DIAGNOSIS — G4733 Obstructive sleep apnea (adult) (pediatric): Secondary | ICD-10-CM | POA: Diagnosis not present

## 2022-08-21 DIAGNOSIS — G4730 Sleep apnea, unspecified: Secondary | ICD-10-CM | POA: Diagnosis not present

## 2022-08-21 DIAGNOSIS — I10 Essential (primary) hypertension: Secondary | ICD-10-CM | POA: Diagnosis not present

## 2022-08-21 DIAGNOSIS — Z5986 Financial insecurity: Secondary | ICD-10-CM | POA: Diagnosis not present

## 2022-08-21 DIAGNOSIS — Z6841 Body Mass Index (BMI) 40.0 and over, adult: Secondary | ICD-10-CM | POA: Diagnosis not present

## 2022-08-21 DIAGNOSIS — Z833 Family history of diabetes mellitus: Secondary | ICD-10-CM | POA: Diagnosis not present

## 2022-08-21 DIAGNOSIS — Z880 Allergy status to penicillin: Secondary | ICD-10-CM | POA: Diagnosis not present

## 2022-08-21 DIAGNOSIS — Z8249 Family history of ischemic heart disease and other diseases of the circulatory system: Secondary | ICD-10-CM | POA: Diagnosis not present

## 2022-08-21 DIAGNOSIS — I4891 Unspecified atrial fibrillation: Secondary | ICD-10-CM | POA: Diagnosis not present

## 2022-09-05 ENCOUNTER — Encounter: Payer: Self-pay | Admitting: Emergency Medicine

## 2022-09-05 ENCOUNTER — Ambulatory Visit: Payer: Medicare PPO | Admitting: Emergency Medicine

## 2022-09-05 VITALS — BP 110/82 | HR 90 | Temp 97.9°F | Ht 63.0 in | Wt 226.2 lb

## 2022-09-05 DIAGNOSIS — T24202A Burn of second degree of unspecified site of left lower limb, except ankle and foot, initial encounter: Secondary | ICD-10-CM | POA: Diagnosis not present

## 2022-09-05 DIAGNOSIS — X18XXXA Contact with other hot metals, initial encounter: Secondary | ICD-10-CM | POA: Diagnosis not present
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
## 2022-09-05 NOTE — Patient Instructions (Signed)
Burn Care, Adult A burn is an injury to the skin or the tissues under the skin. There are three types of burns: First degree. These burns may cause the skin to be red and a bit swollen. Second degree. These burns are very painful and cause the skin to be very red. The skin may also swell, leak fluid, look shiny, and start to have blisters. Third degree. These burns cause lasting damage. They turn the skin white or black and make it look charred, dry, and leathery. Treatment for your burn will depend on the type of burn you have. Taking good care of your burn can help to prevent pain and infection. It can also help the burn heal quickly. How to care for a first-degree burn Right after a burn: Rinse or soak the burn under cool water for 5 minutes or more. Do not put ice on your burn. That can cause more damage. Put a cool, clean, wet cloth on your burn. Put lotion or gel with aloe vera on your burn. Caring for the burn Clean and care for your burn. Your doctor may tell you: To clean the burn using soap and water. To pat the burn dry using a clean cloth. Do not rub or scrub the burn. To put lotion or gel with aloe vera on your burn. How to care for a second-degree burn Right after a burn: Rinse or soak the burn under cool water. Do this for 5 to 10 minutes. Do not put ice on your burn. This can cause more damage. Remove any jewelry near the burned area. Cover the burn with a clean cloth. Caring for the burn Raise (elevate) the burned area above the level of your heart while sitting or lying down. Clean and care for your burn. Your doctor may tell you: To clean or rinse your burn. To put a cream or ointment on the burn. To place a germ-free (sterile) dressing over the burn. A dressing is a material that is placed on a burn to help it heal. How to care for a third-degree burn Right after a burn: Cover the burn with a clean, dry cloth. Seek treatment right away if you have this kind of burn.  You may: Need to stay in the hospital. Have surgery to remove burned tissue. Have surgery to put new skin on the burned area. Be given fluids through an IV tube. Caring for the burn Clean and care for your burn. Your doctor may tell you: To clean or rinse your burn. To put a cream or ointment on the burn. To put a sterile dressing in the burn. This is called packing. To place a sterile dressing over the burn. Other things to do Raise the burned area above the level of your heart while sitting or lying down. Wear splints or immobilizers if told by your doctor. Rest as told by your doctor. Do not do sports or other activities until your doctor approves. How to prevent infection when caring for a burn  Take these steps to prevent infection: Wash your hands with soap and water for at least 20 seconds before and after caring for your burn. If you cannot use soap and water, use hand sanitizer. Wear clean or sterile gloves as told by your doctor. Do not put butter, oil, toothpaste, or other home remedies on the burn. Do not scratch or pick at the burn. Do not break any blisters. Do not peel the skin. Do not rub your burn, even when   you are cleaning it. Check your burn every day for these signs of infection: More redness, swelling, or pain. Warmth. Pus or a bad smell. Red streaks around the burn. Follow these instructions at home Medicines Take over-the-counter and prescription medicines only as told by your doctor. If you were prescribed an antibiotic medicine, use it as told by your doctor. Do not stop using the antibiotic even if your condition gets better. Your doctor may ask you to take medicine for pain before you change your dressing. General instructions  Protect your burn from the sun. Drink enough fluid to keep your pee (urine) pale yellow. Do not use any products that contain nicotine or tobacco, such as cigarettes, e-cigarettes, and chewing tobacco. These can delay healing.  If you need help quitting, ask your doctor. Keep all follow-up visits as told by your doctor. This is important. Contact a doctor if: Your condition does not get better. Your condition gets worse. You have a fever or chills. Your burn feels warm to the touch. You have more redness, swelling, or pain on your burn. Your burn looks different or starts to have black or red spots on it. Your pain does not get better with medicine. Get help right away if: You have more fluid, blood, or pus coming from your burn. You have red streaks near the burn. You have very bad pain. Summary There are three types of burns. They are first degree, second degree, and third degree. Of these, a third-degree burn is most serious. This must be treated right away. Treatment for your burn will depend on the type of burn you have. Do not put butter, oil, toothpaste, or other home remedies on the burn. These things can damage your skin. Follow instructions from your doctor about how to clean and take care of your burn. This information is not intended to replace advice given to you by your health care provider. Make sure you discuss any questions you have with your health care provider. Document Revised: 06/05/2019 Document Reviewed: 02/03/2019 Elsevier Patient Education  2023 Elsevier Inc.  

## 2022-09-28 ENCOUNTER — Encounter: Payer: Self-pay | Admitting: Cardiology

## 2022-09-28 ENCOUNTER — Ambulatory Visit: Payer: Medicare PPO | Attending: Cardiology | Admitting: Cardiology

## 2022-09-28 ENCOUNTER — Encounter: Payer: Self-pay | Admitting: *Deleted

## 2022-09-28 VITALS — BP 136/76 | HR 89 | Ht 63.0 in | Wt 225.0 lb

## 2022-09-28 DIAGNOSIS — I4811 Longstanding persistent atrial fibrillation: Secondary | ICD-10-CM | POA: Diagnosis not present

## 2022-09-28 NOTE — Progress Notes (Signed)
Electrophysiology Office Note   Date:  09/28/2022   ID:  William Dodson, DOB 1950-09-03, MRN [id number]  PCP:  Georgina Quint, MD  Primary Electrophysiologist:  Dr Elberta Fortis    CC: Follow up for atrial fibrillation   History of Present Illness: William Dodson is a 72 y.o. male who presents today for electrophysiology evaluation.      He has a history significant for sleep apnea, atrial fibrillation.  He is post PVI at [facility] in 1997 by Dr. Delena Serve.  He has been in atrial fibrillation for many years.  It is likely that he has become permanent.  He is compliant with his CPAP.  He has a Psychologist, educational, working with metals.  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  Today, denies symptoms of palpitations, chest pain, shortness of breath, orthopnea, PND, lower extremity edema, claudication, dizziness, presyncope, syncope, bleeding, or neurologic sequela. The patient is tolerating medications without difficulties.  He currently feels well.  He has no chest pain or shortness of breath.  He is able to do all of his daily activities without restriction.   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[facility])    "heart infection" in setting of pneumonia 1993   Sleep apnea    recent sleep study, results pending   Past Surgical History:  Procedure Laterality Date   ATRIAL ABLATION SURGERY  1999   afib ablation 1990s at [facility] by Ronn Melena   VENTRAL HERNIA REPAIR N/A 08/24/2016   Procedure: LAPAROSCOPIC VENTRAL HERNIA REPAIR WITH MESH;  Surgeon: Berna Bue, MD;  Location: [facility] OR;  Service: General;  Laterality: N/A;  Laparoscopic Repair of Umbilical and Supraumbilical Hernia with Mesh      Current Outpatient Medications  Medication Sig Dispense Refill   b complex vitamins tablet Take 1 tablet by mouth daily.     BIOTIN PO Take 50 mcg by mouth daily.     Cholecalciferol  (VITAMIN D3) 5000 units CAPS Take 5,000 Units by mouth daily.     CHOLINE PO Take 20 mg by mouth daily.     Chromium Picolinate (CHROMIUM PICOLATE PO) Take 200 mcg by mouth daily.     Folic Acid (FOLATE PO) Take 200 mcg by mouth daily.     ibuprofen (ADVIL,MOTRIN) 200 MG tablet Take 200-400 mg by mouth every 8 (eight) hours as needed for moderate pain.     INOSITOL PO Take 50 mg by mouth daily.     Krill Oil 1000 MG CAPS Take 1,000 mg by mouth daily.     MAGNESIUM PO Take 500 mg by mouth daily.     Milk Thistle 175 MG CAPS Take 175 mg by mouth daily.     Misc Natural Products (SAW PALMETTO) CAPS Take 500 mg by mouth daily.     Niacin (VITAMIN B-3 PO) Take 50 mg by mouth daily.     P-Aminobenzoic Acid (PABA) 100 MG TABS Take 25 mg by mouth daily.     Pantothenic Acid 250 MG CAPS Take 125 mg by mouth daily.     Riboflavin (VITAMIN B-2) 25 MG TABS Take 25 mg by mouth daily.     thiamine (VITAMIN B-1) 50 MG tablet Take 25 mg by mouth daily.     Ubiquinol 100 MG CAPS Take 100 mg by mouth daily.     vitamin  A 10000 UNIT capsule Take 10,000 Units by mouth daily.     vitamin B-12 (CYANOCOBALAMIN) 250 MCG tablet Take 125 mcg by mouth daily.     vitamin B-6 (PYRIDOXINE) 25 MG tablet Take 25 mg by mouth daily.     vitamin C (ASCORBIC ACID) 500 MG tablet Take 500 mg by mouth daily.     meloxicam (MOBIC) 15 MG tablet Take 1 tablet (15 mg total) by mouth daily. (Patient not taking: Reported on 09/28/2022) 10 tablet 1   No current facility-administered medications for this visit.    Allergies:   Morphine and codeine, Penicillins, Doxycycline, Hydrocodone, and Septra [bactrim]   Social History:  The patient  reports that he has never smoked. He has never used smokeless tobacco. He reports current alcohol use of about 6.0 standard drinks of alcohol per week. He reports that he does not use drugs.   Family History:  The patient's family history includes Cancer in his mother; Diabetes in his maternal  grandmother and mother; Heart disease in his father and paternal grandmother; Hyperlipidemia in his father; Mental illness in his brother and mother.   ROS:  Please see the history of present illness.   Otherwise, review of systems is positive for none.   All other systems are reviewed and negative.   PHYSICAL EXAM: VS:  BP 136/76   Pulse 89   Ht 5\' 3"  (1.6 m)   Wt 225 lb (102.1 kg)   SpO2 99%   BMI 39.86 kg/m  , BMI Body mass index is 39.86 kg/m. GEN: Well nourished, well developed, in no acute distress  HEENT: normal  Neck: no JVD, carotid bruits, or masses Cardiac: RRR; no murmurs, rubs, or gallops,no edema  Respiratory:  clear to auscultation bilaterally, normal work of breathing GI: soft, nontender, nondistended, + BS MS: no deformity or atrophy  Skin: warm and dry Neuro:  Strength and sensation are intact Psych: euthymic mood, full affect  EKG:  EKG is ordered today. Personal review of the ekg ordered shows atrial fibrillation    Recent Labs: No results found for requested labs within last 365 days.    Lipid Panel     Component Value Date/Time   CHOL 186 04/12/2020 0727   TRIG 153 (H) 04/12/2020 0727   HDL 48 04/12/2020 0727   CHOLHDL 3.9 04/12/2020 0727   CHOLHDL 3.9 08/12/2013 1632   VLDL 21 08/12/2013 1632   LDLCALC 111 (H) 04/12/2020 0727     Wt Readings from Last 3 Encounters:  09/28/22 225 lb (102.1 kg)  09/05/22 226 lb 4 oz (102.6 kg)  05/30/22 231 lb (104.8 kg)      Other studies Reviewed: Additional studies/ records that were reviewed today include: TTE 05/02/18 Review of the above records today demonstrates:  - Left ventricle: The cavity size was normal. There was mild   concentric hypertrophy. Systolic function was normal. The   estimated ejection fraction was in the range of 50% to 55%. Wall   motion was normal; there were no regional wall motion   abnormalities. - Aortic valve: There was mild regurgitation. Regurgitation   pressure  half-time: 514 ms. - Mitral valve: Transvalvular velocity was within the normal range.   There was no evidence for stenosis. There was trivial   regurgitation. - Left atrium: The atrium was mildly dilated. - Right ventricle: Systolic function was normal. - Atrial septum: No defect or patent foramen ovale was identified   by color flow Doppler. - Tricuspid valve: There  was mild regurgitation. - Pulmonary arteries: Systolic pressure was within the normal   range. PA peak pressure: 25 mm Hg (S).   ASSESSMENT AND PLAN:  1.  Longstanding persistent atrial fibrillation: CHA2DS2-VASc of 1 and thus not anticoagulated.  He feels well in atrial fibrillation.  No plans for rhythm control at this time.  He is well rate controlled.  We did discuss the possibility of further management for stroke prevention with watchman versus medications.  At this point, he would prefer to avoid medications.  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   Current medicines are reviewed at length with the patient today.   The patient does not have concerns regarding his medicines.  The following changes were made today:  none  Labs/ tests ordered today include:  Orders Placed This Encounter  Procedures   EKG 12-Lead     Disposition:   FU 6 months  Signed, Addelynn Batte Jorja Loa, MD  09/28/2022 12:14 PM     [facility] [address] [place] [address] [phone] (office) [phone] (fax)

## 2022-09-28 NOTE — Patient Instructions (Signed)
Medication Instructions:  Your physician recommends that you continue on your current medications as directed. Please refer to the Current Medication list given to you today.  *If you need a refill on your cardiac medications before your next appointment, please call your pharmacy*   Lab Work: None ordered   Testing/Procedures: None ordered   Follow-Up: At [facility], you and your health needs are our priority.  As part of our continuing mission to provide you with exceptional heart care, we have created designated Provider Care Teams.  These Care Teams include your primary Cardiologist (physician) and Advanced Practice Providers (APPs -  Physician Assistants and Nurse Practitioners) who all work together to provide you with the care you need, when you need it.  Your next appointment:   6 month(s)  The format for your next appointment:   In Person  Provider:   Renee Ursuy, PA-C    Thank you for choosing [facility]!!   Jerae Izard, RN [phone]     

## 2022-10-10 ENCOUNTER — Encounter: Payer: Self-pay | Admitting: Emergency Medicine

## 2022-10-10 ENCOUNTER — Other Ambulatory Visit: Payer: Self-pay | Admitting: Emergency Medicine

## 2022-10-10 ENCOUNTER — Ambulatory Visit: Payer: Medicare PPO | Admitting: Emergency Medicine

## 2022-10-10 VITALS — BP 130/70 | HR 93 | Temp 98.3°F | Ht 63.0 in | Wt 224.1 lb

## 2022-10-10 DIAGNOSIS — I4811 Longstanding persistent atrial fibrillation: Secondary | ICD-10-CM

## 2022-10-10 DIAGNOSIS — R809 Proteinuria, unspecified: Secondary | ICD-10-CM | POA: Insufficient documentation

## 2022-10-10 DIAGNOSIS — R81 Glycosuria: Secondary | ICD-10-CM | POA: Diagnosis not present

## 2022-10-10 DIAGNOSIS — G4733 Obstructive sleep apnea (adult) (pediatric): Secondary | ICD-10-CM | POA: Diagnosis not present

## 2022-10-10 DIAGNOSIS — E1169 Type 2 diabetes mellitus with other specified complication: Secondary | ICD-10-CM

## 2022-10-10 LAB — MICROALBUMIN / CREATININE URINE RATIO
Creatinine,U: 95.8 mg/dL
Microalb Creat Ratio: 0.7 mg/g (ref 0.0–30.0)
Microalb, Ur: <0.7 mg/dL (ref 0.0–1.9)

## 2022-10-10 LAB — CBC WITH DIFFERENTIAL/PLATELET
Basophils Absolute: 0.1 10*3/uL (ref 0.0–0.1)
Basophils Relative: 1.2 % (ref 0.0–3.0)
Eosinophils Absolute: 0.2 10*3/uL (ref 0.0–0.7)
Eosinophils Relative: 3.9 % (ref 0.0–5.0)
HCT: 47.3 % (ref 39.0–52.0)
Hemoglobin: 15.7 g/dL (ref 13.0–17.0)
Lymphocytes Relative: 38.6 % (ref 12.0–46.0)
Lymphs Abs: 2.1 10*3/uL (ref 0.7–4.0)
MCHC: 33.3 g/dL (ref 30.0–36.0)
MCV: 93.8 fl (ref 78.0–100.0)
Monocytes Absolute: 0.6 10*3/uL (ref 0.1–1.0)
Monocytes Relative: 10.7 % (ref 3.0–12.0)
Neutro Abs: 2.5 10*3/uL (ref 1.4–7.7)
Neutrophils Relative %: 45.6 % (ref 43.0–77.0)
Platelets: 200 10*3/uL (ref 150.0–400.0)
RBC: 5.04 Mil/uL (ref 4.22–5.81)
RDW: 13.9 % (ref 11.5–15.5)
WBC: 5.4 10*3/uL (ref 4.0–10.5)

## 2022-10-10 LAB — LIPID PANEL
Cholesterol: 183 mg/dL (ref 0–200)
HDL: 50.4 mg/dL (ref >39.00)
NonHDL: 132.83
Total CHOL/HDL Ratio: 4
Triglycerides: 325 mg/dL — ABNORMAL HIGH (ref 0.0–149.0)
VLDL: 65 mg/dL — ABNORMAL HIGH (ref 0.0–40.0)

## 2022-10-10 LAB — COMPREHENSIVE METABOLIC PANEL
ALT: 34 U/L (ref 0–53)
AST: 29 U/L (ref 0–37)
Albumin: 4.1 g/dL (ref 3.5–5.2)
Alkaline Phosphatase: 49 U/L (ref 39–117)
BUN: 15 mg/dL (ref 6–23)
CO2: 26 mEq/L (ref 19–32)
Calcium: 9.3 mg/dL (ref 8.4–10.5)
Chloride: 104 mEq/L (ref 96–112)
Creatinine, Ser: 0.94 mg/dL (ref 0.40–1.50)
GFR: 80.74 mL/min (ref >60.00)
Glucose, Bld: 185 mg/dL — ABNORMAL HIGH (ref 70–99)
Potassium: 3.9 mEq/L (ref 3.5–5.1)
Sodium: 140 mEq/L (ref 135–145)
Total Bilirubin: 0.7 mg/dL (ref 0.2–1.2)
Total Protein: 7.5 g/dL (ref 6.0–8.3)

## 2022-10-10 LAB — URINALYSIS
Bilirubin Urine: NEGATIVE
Hgb urine dipstick: NEGATIVE
Ketones, ur: NEGATIVE
Leukocytes,Ua: NEGATIVE
Nitrite: NEGATIVE
Specific Gravity, Urine: 1.025 (ref 1.000–1.030)
Total Protein, Urine: NEGATIVE
Urine Glucose: NEGATIVE
Urobilinogen, UA: 0.2 (ref 0.0–1.0)
pH: 6 (ref 5.0–8.0)

## 2022-10-10 LAB — LDL CHOLESTEROL, DIRECT: Direct LDL: 120 mg/dL

## 2022-10-10 LAB — HEMOGLOBIN A1C: Hgb A1c MFr Bld: 7.1 % — ABNORMAL HIGH (ref 4.6–6.5)

## 2022-10-10 MED ORDER — ROSUVASTATIN CALCIUM 10 MG PO TABS: 10.0000 mg | ORAL_TABLET | Freq: Every day | ORAL | 3 refills | Status: DC

## 2022-10-10 MED ORDER — METFORMIN HCL 500 MG PO TABS: 500.0000 mg | ORAL_TABLET | Freq: Two times a day (BID) | ORAL | 3 refills | Status: DC

## 2022-10-10 NOTE — Assessment & Plan Note (Signed)
Currently on A-fib.  Controlled ventricular rate. Not on medication at present time Not on long-term anticoagulation.  Prefers not to. Follows up with cardiologist on a regular basis.

## 2022-10-10 NOTE — Patient Instructions (Signed)
Health Maintenance After Age 73 After age 73, you are at a higher risk for certain long-term diseases and infections as well as injuries from falls. Falls are a major cause of broken bones and head injuries in people who are older than age 73. Getting regular preventive care can help to keep you healthy and well. Preventive care includes getting regular testing and making lifestyle changes as recommended by your health care provider. Talk with your health care provider about: Which screenings and tests you should have. A screening is a test that checks for a disease when you have no symptoms. A diet and exercise plan that is right for you. What should I know about screenings and tests to prevent falls? Screening and testing are the best ways to find a health problem early. Early diagnosis and treatment give you the best chance of managing medical conditions that are common after age 73. Certain conditions and lifestyle choices may make you more likely to have a fall. Your health care provider may recommend: Regular vision checks. Poor vision and conditions such as cataracts can make you more likely to have a fall. If you wear glasses, make sure to get your prescription updated if your vision changes. Medicine review. Work with your health care provider to regularly review all of the medicines you are taking, including over-the-counter medicines. Ask your health care provider about any side effects that may make you more likely to have a fall. Tell your health care provider if any medicines that you take make you feel dizzy or sleepy. Strength and balance checks. Your health care provider may recommend certain tests to check your strength and balance while standing, walking, or changing positions. Foot health exam. Foot pain and numbness, as well as not wearing proper footwear, can make you more likely to have a fall. Screenings, including: Osteoporosis screening. Osteoporosis is a condition that causes  the bones to get weaker and break more easily. Blood pressure screening. Blood pressure changes and medicines to control blood pressure can make you feel dizzy. Depression screening. You may be more likely to have a fall if you have a fear of falling, feel depressed, or feel unable to do activities that you used to do. Alcohol use screening. Using too much alcohol can affect your balance and may make you more likely to have a fall. Follow these instructions at home: Lifestyle Do not drink alcohol if: Your health care provider tells you not to drink. If you drink alcohol: Limit how much you have to: 0-1 drink a day for women. 0-2 drinks a day for men. Know how much alcohol is in your drink. In the [place] one drink equals one 12 oz bottle of beer (355 mL), one 5 oz glass of wine (148 mL), or one 1 oz glass of hard liquor (44 mL). Do not use any products that contain nicotine or tobacco. These products include cigarettes, chewing tobacco, and vaping devices, such as e-cigarettes. If you need help quitting, ask your health care provider. Activity  Follow a regular exercise program to stay fit. This will help you maintain your balance. Ask your health care provider what types of exercise are appropriate for you. If you need a cane or walker, use it as recommended by your health care provider. Wear supportive shoes that have nonskid soles. Safety  Remove any tripping hazards, such as rugs, cords, and clutter. Install safety equipment such as grab bars in bathrooms and safety rails on stairs. Keep rooms and walkways   well-lit. General instructions Talk with your health care provider about your risks for falling. Tell your health care provider if: You fall. Be sure to tell your health care provider about all falls, even ones that seem minor. You feel dizzy, tiredness (fatigue), or off-balance. Take over-the-counter and prescription medicines only as told by your health care provider. These include  supplements. Eat a healthy diet and maintain a healthy weight. A healthy diet includes low-fat dairy products, low-fat (lean) meats, and fiber from whole grains, beans, and lots of fruits and vegetables. Stay current with your vaccines. Schedule regular health, dental, and eye exams. Summary Having a healthy lifestyle and getting preventive care can help to protect your health and wellness after age 73. Screening and testing are the best way to find a health problem early and help you avoid having a fall. Early diagnosis and treatment give you the best chance for managing medical conditions that are more common for people who are older than age 73. Falls are a major cause of broken bones and head injuries in people who are older than age 73. Take precautions to prevent a fall at home. Work with your health care provider to learn what changes you can make to improve your health and wellness and to prevent falls. This information is not intended to replace advice given to you by your health care provider. Make sure you discuss any questions you have with your health care provider. Document Revised: 09/05/2020 Document Reviewed: 09/05/2020 Elsevier Patient Education  2024 Elsevier Inc.  

## 2022-10-10 NOTE — Assessment & Plan Note (Signed)
As per recent DOT exam Needs blood work and repeat urine tests

## 2022-10-10 NOTE — Assessment & Plan Note (Signed)
As per recent DOT test. Needs blood work to rule out diabetes

## 2022-10-10 NOTE — Progress Notes (Signed)
William Dodson 72 y.o.   Chief Complaint  Patient presents with   Medical Management of Chronic Issues    F/u appt, patient states he just done a DOT physical and he states they have found protein and traces of glucose in his urine. Patient is concerned    Referral    Referral for a sleep study, needs new CPAP     HISTORY OF PRESENT ILLNESS: This is a 72 y.o. male A1A recent DOT exam showed traces of protein in glucose in the urine. Also has history of sleep apnea.  Needs referral to sleep clinic.  May need new CPAP machine. History of chronic atrial fibrillation.  Not on long-term anticoagulation.  No complications.  Sees cardiologist on a regular basis. No other complaints or medical concerns today.  HPI   Prior to Admission medications   Medication Sig Start Date End Date Taking? Authorizing Provider  b complex vitamins tablet Take 1 tablet by mouth daily.   Yes [provider]  BIOTIN PO Take 50 mcg by mouth daily.   Yes [provider]  Cholecalciferol (VITAMIN D3) 5000 units CAPS Take 5,000 Units by mouth daily.   Yes [provider]  CHOLINE PO Take 20 mg by mouth daily.   Yes [provider]  Chromium Picolinate (CHROMIUM PICOLATE PO) Take 200 mcg by mouth daily.   Yes [provider]  Folic Acid (FOLATE PO) Take 200 mcg by mouth daily.   Yes [provider]  ibuprofen (ADVIL,MOTRIN) 200 MG tablet Take 200-400 mg by mouth every 8 (eight) hours as needed for moderate pain.   Yes [provider]  INOSITOL PO Take 50 mg by mouth daily.   Yes [provider]  Boris Lown Oil 1000 MG CAPS Take 1,000 mg by mouth daily.   Yes [provider]  MAGNESIUM PO Take 500 mg by mouth daily.   Yes [provider]  Milk Thistle 175 MG CAPS Take 175 mg by mouth daily.   Yes [provider]  Misc Natural Products (SAW PALMETTO) CAPS Take 500 mg by mouth daily.   Yes [provider]  Niacin  (VITAMIN B-3 PO) Take 50 mg by mouth daily.   Yes [provider]  P-Aminobenzoic Acid (PABA) 100 MG TABS Take 25 mg by mouth daily.   Yes [provider]  Pantothenic Acid 250 MG CAPS Take 125 mg by mouth daily.   Yes [provider]  Riboflavin (VITAMIN B-2) 25 MG TABS Take 25 mg by mouth daily.   Yes [provider]  thiamine (VITAMIN B-1) 50 MG tablet Take 25 mg by mouth daily.   Yes [provider]  Ubiquinol 100 MG CAPS Take 100 mg by mouth daily.   Yes [provider]  vitamin A 16109 UNIT capsule Take 10,000 Units by mouth daily.   Yes [provider]  vitamin B-12 (CYANOCOBALAMIN) 250 MCG tablet Take 125 mcg by mouth daily.   Yes [provider]  vitamin B-6 (PYRIDOXINE) 25 MG tablet Take 25 mg by mouth daily.   Yes [provider]  vitamin C (ASCORBIC ACID) 500 MG tablet Take 500 mg by mouth daily.   Yes [provider]  meloxicam (MOBIC) 15 MG tablet Take 1 tablet (15 mg total) by mouth daily. 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   ONYCHOMYCOSIS 06/27/2006   HYPERLIPIDEMIA 06/27/2006   ATRIAL FIBRILLATION 06/27/2006    Past Medical History:  Diagnosis Date   Atrial fibrillation (HCC)    persistent,  s/p PVI [facility] Dr Delena [facility]   Myalgia    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[facility])    "heart infection" in setting of pneumonia 1993   Sleep apnea    recent sleep study, results pending    Past Surgical History:  Procedure  Laterality Date   ATRIAL ABLATION SURGERY  1999   afib ablation 1990s at [facility] by Ronn Melena   VENTRAL HERNIA REPAIR N/A 08/24/2016   Procedure: LAPAROSCOPIC VENTRAL HERNIA REPAIR WITH MESH;  Surgeon: Berna Bue, MD;  Location: [facility] OR;  Service: General;  Laterality: N/A;  Laparoscopic Repair of Umbilical and Supraumbilical Hernia with Mesh     Social History   Socioeconomic History   Marital status: Married    Spouse name: Not on file   Number of children: Not on file   Years of education: Not on file   Highest education level: Professional school degree (e.g., MD, DDS, DVM, JD)  Occupational History   Occupation: Sculptor  Tobacco Use   Smoking status: Never   Smokeless tobacco: Never   Tobacco comments:    tried smoking 30 years ago  Vaping Use   Vaping Use: Never used  Substance and Sexual Activity   Alcohol use: Yes    Alcohol/week: 6.0 standard drinks of alcohol    Types: 6 Glasses of wine per week    Comment: 1 glass of wine at night   Drug use: No   Sexual activity: Not on file  Other Topics Concern   Not on file  Social History Narrative   Married   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[employer]   Two children also pursuing Art   Sedentary      Sculptor, primarily with iron   Social Determinants of Health   Financial Resource Strain: Low Risk  (09/04/2022)   Overall Financial Resource Strain (CARDIA)    Difficulty of Paying Living Expenses: Not very hard  Food Insecurity: No Food Insecurity (09/04/2022)   Hunger Vital Sign    Worried About Running Out of Food in the Last Year: Never true    Ran Out of Food in the Last Year: Never true  Transportation Needs: No Transportation Needs (09/04/2022)   PRAPARE - Administrator, Civil Service (Medical): No    Lack of Transportation (Non-Medical): No  Physical Activity: Sufficiently Active (09/04/2022)   Exercise Vital Sign    Days of Exercise per Week: 5 days    Minutes of Exercise per Session: 90 min  Stress: Stress  Concern Present (09/04/2022)   [employer] of Occupational Health - Occupational Stress Questionnaire    Feeling of Stress : To some extent  Social Connections: Socially Integrated (09/04/2022)   Social Connection and Isolation Panel [NHANES]    Frequency of Communication with Friends and Family: Once a week    Frequency of Social Gatherings with Friends and Family: More than three times a week  Attends Religious Services: More than 4 times per year    Active Member of Clubs or Organizations: Yes    Attends Banker Meetings: 1 to 4 times per year    Marital Status: Married  Catering manager Violence: Not At Risk (05/15/2022)   Humiliation, Afraid, Rape, and Kick questionnaire    Fear of Current or Ex-Partner: No    Emotionally Abused: No    Physically Abused: No    Sexually Abused: No    Family History  Problem Relation Age of Onset   Diabetes Mother    Cancer Mother    Mental illness Mother    Heart disease Father    Hyperlipidemia Father    Mental illness Brother    Diabetes Maternal Grandmother    Heart disease Paternal Grandmother      Review of Systems  Constitutional: Negative.  Negative for chills and fever.  Respiratory: Negative.  Negative for cough and shortness of breath.   Cardiovascular: Negative.  Negative for chest pain and palpitations.  Gastrointestinal:  Negative for abdominal pain, nausea and vomiting.  Genitourinary: Negative.  Negative for dysuria and hematuria.  Skin: Negative.  Negative for rash.  Neurological: Negative.  Negative for dizziness and headaches.  All other systems reviewed and are negative.   Today's Vitals   10/10/22 0805  BP: 130/70  Pulse: 93  Temp: 98.3 F (36.8 C)  TempSrc: Oral  SpO2: 95%  Weight: 224 lb 2 oz (101.7 kg)  Height: 5\' 3"  (1.6 m)   Body mass index is 39.7 kg/m.   Physical Exam Vitals reviewed.  Constitutional:      Appearance: Normal appearance.  HENT:     Head: Normocephalic.      Mouth/Throat:     Mouth: Mucous membranes are moist.     Pharynx: Oropharynx is clear.  Eyes:     Extraocular Movements: Extraocular movements intact.     Pupils: Pupils are equal, round, and reactive to light.  Cardiovascular:     Rate and Rhythm: Normal rate and regular rhythm.     Pulses: Normal pulses.     Heart sounds: Normal heart sounds.  Pulmonary:     Effort: Pulmonary effort is normal.     Breath sounds: Normal breath sounds.  Abdominal:     Palpations: Abdomen is soft.  Musculoskeletal:     Cervical back: No tenderness.  Lymphadenopathy:     Cervical: No cervical adenopathy.  Skin:    General: Skin is warm and dry.  Neurological:     Mental Status: He is alert and oriented to person, place, and time.  Psychiatric:        Mood and Affect: Mood normal.        Behavior: Behavior normal.      ASSESSMENT & PLAN: A total of 45 minutes was spent with the patient and counseling/coordination of care regarding preparing for this visit, review of most recent office visit notes, review of multiple chronic medical conditions and their management, review of all medications, need for blood work today, cardiovascular risks associated with atrial fibrillation, prognosis, documentation, and need for follow-up  Problem List Items Addressed This Visit       Cardiovascular and Mediastinum   ATRIAL FIBRILLATION    Currently on A-fib.  Controlled ventricular rate. Not on medication at present time Not on long-term anticoagulation.  Prefers not to. Follows up with cardiologist on a regular basis.        Respiratory   Obstructive sleep  apnea    Stable.  On CPAP treatment. Needs referral to sleep clinic Referral placed today.      Relevant Orders   Ambulatory referral to Sleep Studies     Other   Proteinuria    As per recent DOT exam Needs blood work and repeat urine tests      Relevant Orders   CBC with Differential/Platelet   Comprehensive metabolic panel    Hemoglobin A1c   Lipid panel   Urinalysis   Urine Microalbumin w/creat. ratio   Glycosuria - Primary    As per recent DOT test. Needs blood work to rule out diabetes      Relevant Orders   CBC with Differential/Platelet   Comprehensive metabolic panel   Hemoglobin A1c   Lipid panel   Patient Instructions  Health Maintenance After Age 73 After age 15, you are at a higher risk for certain long-term diseases and infections as well as injuries from falls. Falls are a major cause of broken bones and head injuries in people who are older than age 85. Getting regular preventive care can help to keep you healthy and well. Preventive care includes getting regular testing and making lifestyle changes as recommended by your health care provider. Talk with your health care provider about: Which screenings and tests you should have. A screening is a test that checks for a disease when you have no symptoms. A diet and exercise plan that is right for you. What should I know about screenings and tests to prevent falls? Screening and testing are the best ways to find a health problem early. Early diagnosis and treatment give you the best chance of managing medical conditions that are common after age 75. Certain conditions and lifestyle choices may make you more likely to have a fall. Your health care provider may recommend: Regular vision checks. Poor vision and conditions such as cataracts can make you more likely to have a fall. If you wear glasses, make sure to get your prescription updated if your vision changes. Medicine review. Work with your health care provider to regularly review all of the medicines you are taking, including over-the-counter medicines. Ask your health care provider about any side effects that may make you more likely to have a fall. Tell your health care provider if any medicines that you take make you feel dizzy or sleepy. Strength and balance checks. Your health care provider may  recommend certain tests to check your strength and balance while standing, walking, or changing positions. Foot health exam. Foot pain and numbness, as well as not wearing proper footwear, can make you more likely to have a fall. Screenings, including: Osteoporosis screening. Osteoporosis is a condition that causes the bones to get weaker and break more easily. Blood pressure screening. Blood pressure changes and medicines to control blood pressure can make you feel dizzy. Depression screening. You may be more likely to have a fall if you have a fear of falling, feel depressed, or feel unable to do activities that you used to do. Alcohol use screening. Using too much alcohol can affect your balance and may make you more likely to have a fall. Follow these instructions at home: Lifestyle Do not drink alcohol if: Your health care provider tells you not to drink. If you drink alcohol: Limit how much you have to: 0-1 drink a day for women. 0-2 drinks a day for men. Know how much alcohol is in your drink. In the [place] one drink equals one 12  oz bottle of beer (355 mL), one 5 oz glass of wine (148 mL), or one 1 oz glass of hard liquor (44 mL). Do not use any products that contain nicotine or tobacco. These products include cigarettes, chewing tobacco, and vaping devices, such as e-cigarettes. If you need help quitting, ask your health care provider. Activity  Follow a regular exercise program to stay fit. This will help you maintain your balance. Ask your health care provider what types of exercise are appropriate for you. If you need a cane or walker, use it as recommended by your health care provider. Wear supportive shoes that have nonskid soles. Safety  Remove any tripping hazards, such as rugs, cords, and clutter. Install safety equipment such as grab bars in bathrooms and safety rails on stairs. Keep rooms and walkways well-lit. General instructions Talk with your health care provider  about your risks for falling. Tell your health care provider if: You fall. Be sure to tell your health care provider about all falls, even ones that seem minor. You feel dizzy, tiredness (fatigue), or off-balance. Take over-the-counter and prescription medicines only as told by your health care provider. These include supplements. Eat a healthy diet and maintain a healthy weight. A healthy diet includes low-fat dairy products, low-fat (lean) meats, and fiber from whole grains, beans, and lots of fruits and vegetables. Stay current with your vaccines. Schedule regular health, dental, and eye exams. Summary Having a healthy lifestyle and getting preventive care can help to protect your health and wellness after age 63. Screening and testing are the best way to find a health problem early and help you avoid having a fall. Early diagnosis and treatment give you the best chance for managing medical conditions that are more common for people who are older than age 25. Falls are a major cause of broken bones and head injuries in people who are older than age 43. Take precautions to prevent a fall at home. Work with your health care provider to learn what changes you can make to improve your health and wellness and to prevent falls. This information is not intended to replace advice given to you by your health care provider. Make sure you discuss any questions you have with your health care provider. Document Revised: 09/05/2020 Document Reviewed: 09/05/2020 Elsevier Patient Education  2024 Elsevier Inc.     Edwina Barth, MD [facility]

## 2022-10-10 NOTE — Assessment & Plan Note (Signed)
Stable.  On CPAP treatment. Needs referral to sleep clinic Referral placed today.

## 2022-11-05 DIAGNOSIS — G4733 Obstructive sleep apnea (adult) (pediatric): Secondary | ICD-10-CM | POA: Diagnosis not present

## 2022-11-13 ENCOUNTER — Ambulatory Visit (INDEPENDENT_AMBULATORY_CARE_PROVIDER_SITE_OTHER): Payer: Medicare PPO | Admitting: Neurology

## 2022-11-13 ENCOUNTER — Encounter: Payer: Self-pay | Admitting: Neurology

## 2022-11-13 VITALS — BP 137/86 | HR 84 | Ht 64.5 in | Wt 219.0 lb

## 2022-11-13 DIAGNOSIS — G4733 Obstructive sleep apnea (adult) (pediatric): Secondary | ICD-10-CM

## 2022-11-13 DIAGNOSIS — N060 Isolated proteinuria with minor glomerular abnormality: Secondary | ICD-10-CM

## 2022-11-13 DIAGNOSIS — I4811 Longstanding persistent atrial fibrillation: Secondary | ICD-10-CM

## 2022-11-13 DIAGNOSIS — I519 Heart disease, unspecified: Secondary | ICD-10-CM

## 2022-11-13 NOTE — Progress Notes (Signed)
SLEEP MEDICINE CLINIC    Provider:  Melvyn Novas, MD  Primary Care Physician:  Georgina Quint, MD [address] [place] [address]     Referring Provider: Edwina Barth [place], [place] [address] [place],  [place] [address]          Chief Complaint according to patient   Patient presents with:     New Patient (Initial Visit)     Pt states that he is here to re-establish for management of sleep apnea. Last SS 2012his machine is ordered through [facility] and was set up 08/20/11. 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       HISTORY OF PRESENT ILLNESS:  WAKE Dodson is a 72 y.o. male patient who is seen upon referral on 11/13/2022 from Dr Natale Milch for a new evaluation.  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 He has been using his CPAP which 10 years or older. He is originally for  [place], [place]. The patient had the first sleep study in the year 2012- Here at [facility] sleep,  with a result of an AHI ( Apnea Hypopnea index)  of 35/h ,  REM AHI was 61.4/h and  supine AHI was 62/h.with  an oxygen saturation Nadir at SP02 83 for 8 minutes total desat time.  When I first  met  Mr [name] he was 27  with longstanding atrial fibrillation.  Referred by Dr Merla Riches.  Afib ablation at [facility] 19 96 with Ronn Melena ,MD.  Last seen by Dr Shelva Majestic.  Describes having normal  results by cath and normal stress tests in past.  Indicates severe episode of inflamation of heart over 20 years ago  ( 1994) that started  as a pneumonia a with final myocarditis and endocarditis.  This was the beginning of all his cardiac -electrical problems. Not clear if this was myocarditis or not.  No records available in echart.  Indicates  primary care by Dr Roanna Epley at time, Family Practice  [place] score was less than  2.  He was asymptomatic.  Thinks he goes in and out of fibrillation but I suspect it is chronic.   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  8/10 had laceration to left arm from work related accident.   On questioning denies dyspnea, SSCP, syncope or TIAls.  Occasonal palpitations.   Intolerant to multiple beta blockers in past.   Not on any AV nodal blocking drugs at this time and no ASA.   Thought it would be useful for him to F/U with Dr Johney Frame since he has had previous ablation.    ROS: Denies fever, malais, weight loss, blurry vision, decreased visual acuity, cough, sputum, SOB, hemoptysis, pleuritic pain, palpitaitons, heartburn, abdominal pain, melena, lower extremity edema, claudication, or rash.  All other systems reviewed and negative   The patient currently works as a Building surveyor  Tobacco use; none .  ETOH use ; wine , 2 a week max. ,  Caffeine intake in form of Coffee( 1 a day) Soda( ) Tea ( ) or energy drinks Exercise in form of walking .   Hobbies : all kinds of creative processes.     Sleep habits are as follows: The  patient's dinner time is between 5-7 PM. The patient goes to bed at 10-11 PM and continues to sleep for 7-8 hours, wakes for once- one bathroom breaks, the first time at 4 AM.   The preferred sleep position is lateral and supine , with the support of 1-2 pillows. Dreams are reportedly  frequent/vivid.   The patient wakes up spontaneously at 6 .30 with an alarm. 6.30  AM is the usual rise time. He reports feeling refreshed or restored in AM, naps are rarely taken .   Review of Systems: Out of a complete 14 system review, the patient complains of only the following symptoms, and all other reviewed systems are negative.:  Fatigue, sleepiness , snoring, fragmented sleep   How likely are you to doze in the following situations: 0 = not likely, 1 = slight chance, 2 = moderate chance, 3 = high chance    Sitting and Reading? Watching Television? Sitting inactive in a public place (theater or meeting)? As a passenger in a car for an hour without a break? Lying down in the afternoon when circumstances permit? Sitting and talking to someone? Sitting quietly after lunch without alcohol? In a car, while stopped for a few minutes in traffic?   Total = 02/ 24 points   FSS endorsed at 35/ 63 points.   Social History   Socioeconomic History   Marital status: Married    Spouse name: Not on file   Number of children: Not on file   Years of education: Not on file   Highest education level: Professional school degree (e.g., MD, DDS, DVM, JD)  Occupational History   Occupation: Sculptor  Tobacco Use   Smoking status: Never   Smokeless tobacco: Never   Tobacco comments:    tried smoking 30 years ago  Vaping Use   Vaping status: Never Used  Substance and Sexual Activity   Alcohol use: Yes    Alcohol/week: 6.0 standard drinks of alcohol    Types: 6 Glasses of wine per week    Comment: 1 glass of wine at night   Drug use: No   Sexual activity: Not on file  Other Topics Concern   Not on file  Social History Narrative   Married   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[employer]   Two children also pursuing Art   Sedentary      Sculptor, primarily with iron   Social Determinants of Health   Financial Resource Strain: Low Risk  (09/04/2022)   Overall Financial Resource Strain (CARDIA)    Difficulty of Paying Living Expenses: Not very hard  Food Insecurity: No Food Insecurity (09/04/2022)   Hunger Vital Sign    Worried About Running Out of Food in the Last Year: Never true    Ran Out of Food in the Last Year: Never true  Transportation Needs: No Transportation Needs (09/04/2022)   PRAPARE - Administrator, Civil Service (Medical): No    Lack of Transportation (Non-Medical): No  Physical Activity: Sufficiently Active (09/04/2022)   Exercise Vital Sign    Days of Exercise per Week: 5 days    Minutes  of Exercise per Session: 90 min  Stress: Stress Concern Present (09/04/2022)   [employer] of Occupational Health - Occupational Stress Questionnaire    Feeling of Stress : To some extent  Social Connections: Socially Integrated (09/04/2022)   Social Connection and Isolation Panel [NHANES]    Frequency of Communication with Friends and Family: Once a week    Frequency of  Social Gatherings with Friends and Family: More than three times a week    Attends Religious Services: More than 4 times per year    Active Member of [employer] or Organizations: Yes    Attends Engineer, structural: 1 to 4 times per year    Marital Status: Married    Family History  Problem Relation Age of Onset   Diabetes Mother    Cancer Mother    Mental illness Mother    Heart disease Father    Hyperlipidemia Father    Mental illness Brother    Diabetes Maternal Grandmother    Heart disease Paternal Grandmother     Past Medical History:  Diagnosis Date   Atrial fibrillation (HCC)    persistent,  s/p PVI by Dr Delena [facility] at [facility] 1990[facility]   Myalgia    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(HCC)    "heart infection" in setting of pneumonia 1993   Sleep apnea    recent sleep study, results pending    Past Surgical History:  Procedure Laterality Date   ATRIAL ABLATION SURGERY  1999   afib ablation 1990s at [facility] by Ronn Melena   VENTRAL HERNIA REPAIR N/A 08/24/2016   Procedure: LAPAROSCOPIC VENTRAL HERNIA REPAIR WITH MESH;  Surgeon: Berna Bue, MD;  Location: [facility] OR;  Service: General;  Laterality: N/A;  Laparoscopic Repair of Umbilical and Supraumbilical Hernia with Mesh      Current Outpatient Medications on File Prior to Visit  Medication Sig Dispense Refill   b complex vitamins tablet Take 1 tablet by mouth daily.     BIOTIN PO Take 50 mcg by mouth daily.     Cholecalciferol (VITAMIN D3) 5000 units CAPS Take 5,000 Units by mouth daily.     CHOLINE PO Take 20 mg by mouth daily.     Chromium Picolinate (CHROMIUM  PICOLATE PO) Take 200 mcg by mouth daily.     Folic Acid (FOLATE PO) Take 200 mcg by mouth daily.     ibuprofen (ADVIL,MOTRIN) 200 MG tablet Take 200-400 mg by mouth every 8 (eight) hours as needed for moderate pain.     INOSITOL PO Take 50 mg by mouth daily.     Krill Oil 1000 MG CAPS Take 1,000 mg by mouth daily.     MAGNESIUM PO Take 500 mg by mouth daily.     meloxicam (MOBIC) 15 MG tablet Take 1 tablet (15 mg total) by mouth daily. 10 tablet 1   metFORMIN (GLUCOPHAGE) 500 MG tablet Take 1 tablet (500 mg total) by mouth 2 (two) times daily with a meal. 180 tablet 3   Milk Thistle 175 MG CAPS Take 175 mg by mouth daily.     Misc Natural Products (SAW PALMETTO) CAPS Take 500 mg by mouth daily.     Niacin (VITAMIN B-3 PO) Take 50 mg by mouth daily.     P-Aminobenzoic Acid (PABA) 100 MG TABS Take 25 mg by mouth daily.     Pantothenic Acid 250 MG CAPS Take 125 mg by mouth daily.     Riboflavin (VITAMIN B-2) 25 MG TABS Take 25 mg by mouth daily.     rosuvastatin (CRESTOR) 10 MG tablet Take 1 tablet (10 mg total) by mouth daily. 90 tablet 3   thiamine (VITAMIN B-1) 50 MG tablet Take 25 mg by mouth daily.     Ubiquinol 100 MG CAPS Take 100 mg by mouth daily.     vitamin A [address]109 UNIT capsule Take 10,000 Units by mouth daily.  vitamin B-12 (CYANOCOBALAMIN) 250 MCG tablet Take 125 mcg by mouth daily.     vitamin B-6 (PYRIDOXINE) 25 MG tablet Take 25 mg by mouth daily.     vitamin C (ASCORBIC ACID) 500 MG tablet Take 500 mg by mouth daily.     No current facility-administered medications on file prior to visit.    Allergies  Allergen Reactions   Morphine And Codeine Other (See Comments)    Increased hearing, hallucinations, weird feeling   Penicillins    Doxycycline Itching and Rash        Hydrocodone Itching and Rash   Septra [Bactrim] Itching and Rash          DIAGNOSTIC DATA (LABS, IMAGING, TESTING) - I reviewed patient records, labs, notes, testing and imaging myself where  available.  Lab Results  Component Value Date   WBC 5.4 10/10/2022   HGB 15.7 10/10/2022   HCT 47.3 10/10/2022   MCV 93.8 10/10/2022   PLT 200.0 10/10/2022      Component Value Date/Time   NA 140 10/10/2022 0833   NA 140 04/13/2020 1503   K 3.9 10/10/2022 0833   CL 104 10/10/2022 0833   CO2 26 10/10/2022 0833   GLUCOSE 185 (H) 10/10/2022 0833   BUN 15 10/10/2022 0833   BUN 16 04/13/2020 1503   CREATININE 0.94 10/10/2022 0833   CREATININE 1.05 08/12/2013 1632   CALCIUM 9.3 10/10/2022 0833   PROT 7.5 10/10/2022 0833   PROT 7.1 04/13/2020 1503   ALBUMIN 4.1 10/10/2022 0833   ALBUMIN 4.0 04/13/2020 1503   AST 29 10/10/2022 0833   ALT 34 10/10/2022 0833   ALKPHOS 49 10/10/2022 0833   BILITOT 0.7 10/10/2022 0833   BILITOT 0.6 04/13/2020 1503   GFRNONAA 78 04/13/2020 1503   GFRNONAA 75 08/12/2013 1632   GFRAA 90 04/13/2020 1503   GFRAA 87 08/12/2013 1632   Lab Results  Component Value Date   CHOL 183 10/10/2022   HDL 50.40 10/10/2022   LDLCALC 111 (H) 04/12/2020   LDLDIRECT 120.0 10/10/2022   TRIG 325.0 (H) 10/10/2022   CHOLHDL 4 10/10/2022   Lab Results  Component Value Date   HGBA1C 7.1 (H) 10/10/2022   No results found for: "VITAMINB12" Lab Results  Component Value Date   TSH 2.906 08/12/2013    PHYSICAL EXAM:  Today's Vitals   11/13/22 1028  BP: 137/86  Pulse: 84  Weight: 219 lb (99.3 kg)  Height: 5' 4.5" (1.638 m)   Body mass index is 37.01 kg/m.   Wt Readings from Last 3 Encounters:  11/13/22 219 lb (99.3 kg)  10/10/22 224 lb 2 oz (101.7 kg)  09/28/22 225 lb (102.1 kg)     Ht Readings from Last 3 Encounters:  11/13/22 5' 4.5" (1.638 m)  10/10/22 5\' 3"  (1.6 m)  09/28/22 5\' 3"  (1.6 m)      General: The patient is awake, alert and appears not in acute distress. The patient is well groomed. Head: Normocephalic, atraumatic. Neck is supple. Mallampati 2-3, lateral  crowded- ,  neck circumference:17.25 inches . Nasal airflow  patent. Facial  hair  Dental status: biological  Cardiovascular:  Regular rate and cardiac rhythm by pulse,  without distended neck veins. Respiratory: Lungs are clear to auscultation.  Skin:  Without evidence of ankle edema, or rash. Trunk: The patient's posture is erect.   NEUROLOGIC EXAM: The patient is awake and alert, oriented to place and time.   Memory subjective described as intact.  Attention span &  concentration ability appears normal.  Speech is fluent,  without  dysarthria, dysphonia or aphasia.  Mood and affect are appropriate.   Cranial nerves: no loss of smell or taste reported  Pupils are equal and briskly reactive to light. Funduscopic exam deferred..  Extraocular movements in vertical and horizontal planes were intact and without nystagmus. No Diplopia. Visual fields by finger perimetry are intact. Hearing was intact to soft voice and finger rubbing.    Facial sensation intact to fine touch.  Facial motor strength is symmetric and tongue and his  uvula move midline.  Neck ROM : rotation, tilt and flexion extension were normal for age and shoulder shrug was symmetrical.    Motor exam:  Symmetric bulk, tone and ROM.   Normal tone without cog wheeling, symmetric grip strength .   Sensory:  Fine touch, pinprick and vibration were tested  and  normal.  Proprioception tested in the upper extremities was normal.   Coordination: Rapid alternating movements in the fingers/hands were of normal speed.  The Finger-to-nose maneuver was intact without evidence of ataxia, dysmetria or tremor.   Gait and station: Patient could rise unassisted from a seated position, walked without assistive device.  Stance is of normal width/ base .  Toe and heel walk were deferred.  Deep tendon reflexes: in the  upper and lower extremities are symmetric and intact.  Babinski response was deferred.  ASSESSMENT AND PLAN 72 y.o. year old male  here with:  Highly compliant CPAP user:  Residual AHI is 1.5/h  and  set at 7 m water.  7 h and 16 minutes average use,  high air leak ( facial hair ) , needs a new mask fitting .   I ordered a HST one night without CPAP.     1) OSA on CPAP, originally issued in 2012 - and still used.   2) atrial fib, after failed ablation and failed cardioversion. 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   3) He is not sleepy and not fatigued- he sleeps enough, no need for naps, he just wants to continue  the CPAP therapy which has helped him so much.     I plan to follow up either personally or through our NP within 2-4 months.   I would like to thank  Georgina Quint, Md [address] [place],  [place] [address] for allowing me to meet with and to take care of this pleasant patient.   CC: I will share my notes with PCP, and Cardiology .  After spending a total time of  45  minutes face to face and additional time for physical and neurologic examination, review of laboratory studies,  personal review of imaging studies, reports and results of other testing and review of referral information / records as far as provided in visit,   Electronically signed by: Melvyn Novas, MD 11/13/2022 10:39 AM  [facility] Board certified by The [employer] of Sleep Medicine and Diplomate of the [employer] of Sleep Medicine. Board certified In Neurology through the ABPN, Fellow of the [employer] of Neurology.

## 2022-12-18 ENCOUNTER — Ambulatory Visit (INDEPENDENT_AMBULATORY_CARE_PROVIDER_SITE_OTHER): Payer: Medicare PPO | Admitting: Neurology

## 2022-12-18 DIAGNOSIS — I4811 Longstanding persistent atrial fibrillation: Secondary | ICD-10-CM

## 2022-12-18 DIAGNOSIS — G4733 Obstructive sleep apnea (adult) (pediatric): Secondary | ICD-10-CM

## 2022-12-18 DIAGNOSIS — I4819 Other persistent atrial fibrillation: Secondary | ICD-10-CM

## 2022-12-18 DIAGNOSIS — I519 Heart disease, unspecified: Secondary | ICD-10-CM

## 2022-12-18 DIAGNOSIS — N060 Isolated proteinuria with minor glomerular abnormality: Secondary | ICD-10-CM

## 2022-12-19 NOTE — Progress Notes (Signed)
[facility]  William Dodson "William Dodson" Male, 72 y.o., 14-Dec-1950 MRN: [id number]   HOME SLEEP TEST REPORT ( by [facility] PAT)   STUDY DATE:  12-19-2022   ORDERING CLINICIAN: Melvyn Novas, MD  REFERRING CLINICIAN: Dr William Dodson   CLINICAL INFORMATION/HISTORY: William Dodson is a 72 y.o. male patient who is seen upon referral on 11/13/2022 from Dr William Dodson for a new evaluation. he has congestive HF, chronic systolic dysfunction. 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 He has been using his CPAP which 10 years or older. He is originally for  [place], [place]. William patient had William first sleep study in William year 2012- Here at [facility] sleep,  with a result of an AHI ( Apnea Hypopnea index)  of 35/h ,  REM AHI was 61.4/h and  supine AHI was 62/h.with  an oxygen saturation Nadir at SP02 83 for 8 minutes total desat time.   When I first met William Dodson, he was 72 yo with longstanding atrial fibrillation.  Referred by Dr William Dodson.  Afib ablation at [facility] 96 with William Dodson ,MD.  Last seen by Dr William Dodson.  Describes having normal  results by cath and normal stress tests in past.  Indicates severe episode of inflamation of heart over 20 years ago  ( 1994) that started  as a pneumonia a with final myocarditis and endocarditis.      Epworth sleepiness score: 02/ 24 points   FSS endorsed at 35/ 63 points.    BMI: 37.3 kg/m   Neck Circumference: 17"   FINDINGS:   Sleep Summary:   Total Recording Time (hours, min): 9 hours 7 minutes      Total Sleep Time (hours, min):   7 hours 29 minutes              Percent REM (%): 21.5%                                      Respiratory Indices by CMS criteria:   Calculated pAHI (per hour):   25.5/h by AASM criteria and 15.4/h by CMS criteria                          REM pAHI:    23.4/h                                              NREM pAHI: 13.2/h                            Positional AHI: William patient slept for most of William night in supine position with 282 minutes with an AHI of 14.8/h following CMS criteria.  Snoring statistics show a mean volume of snoring at 41 dB  Oxygen Saturation Statistics:   Oxygen Saturation (%) Mean:   Between William nadir at 87 and a maximum of 99% with a mean saturation of 94%                               O2 Saturation (minutes) <89%: 0.1 minutes         Pulse Rate Statistics:   Pulse Mean (bpm):   81 bpm              Pulse Range:     Between 60 and 131 bpm            IMPRESSION:  This HST confirms William presence of moderate sleep apnea following CMS criteria and AASM criteria.  No central apneas were detected by this home sleep test device.  I strongly recommend to continue with CPAP use given his history is a patient with atrial fibrillation with failed ablation and failed cardioversion history.  He is not anticoagulated due to his occupational risks.   RECOMMENDATION: Autotitration CPAP device, to replace his current CPAP which was issued in 2012. Auto titrating device by [employer] with a setting between 5 and 13 cm water pressure 2 cm EPR, William new mask has to be fitted this patient has a lot of air leakage and also has facial hair.      INTERPRETING PHYSICIAN:   William Novas, MD

## 2022-12-24 ENCOUNTER — Ambulatory Visit (INDEPENDENT_AMBULATORY_CARE_PROVIDER_SITE_OTHER): Payer: Medicare PPO | Admitting: Emergency Medicine

## 2022-12-24 ENCOUNTER — Encounter: Payer: Self-pay | Admitting: Emergency Medicine

## 2022-12-24 VITALS — BP 120/82 | HR 85 | Temp 98.5°F | Ht 64.5 in | Wt 220.2 lb

## 2022-12-24 DIAGNOSIS — E1169 Type 2 diabetes mellitus with other specified complication: Secondary | ICD-10-CM

## 2022-12-24 DIAGNOSIS — E785 Hyperlipidemia, unspecified: Secondary | ICD-10-CM | POA: Diagnosis not present

## 2022-12-24 DIAGNOSIS — Z23 Encounter for immunization: Secondary | ICD-10-CM | POA: Diagnosis not present

## 2022-12-24 LAB — LIPID PANEL
Cholesterol: 201 mg/dL — ABNORMAL HIGH (ref 0–200)
HDL: 41.4 mg/dL (ref >39.00)
NonHDL: 160.03
Total CHOL/HDL Ratio: 5
Triglycerides: 257 mg/dL — ABNORMAL HIGH (ref 0.0–149.0)
VLDL: 51.4 mg/dL — ABNORMAL HIGH (ref 0.0–40.0)

## 2022-12-24 LAB — COMPREHENSIVE METABOLIC PANEL
ALT: 29 U/L (ref 0–53)
AST: 24 U/L (ref 0–37)
Albumin: 4.1 g/dL (ref 3.5–5.2)
Alkaline Phosphatase: 51 U/L (ref 39–117)
BUN: 15 mg/dL (ref 6–23)
CO2: 27 mEq/L (ref 19–32)
Calcium: 9.3 mg/dL (ref 8.4–10.5)
Chloride: 102 mEq/L (ref 96–112)
Creatinine, Ser: 0.94 mg/dL (ref 0.40–1.50)
GFR: 80.62 mL/min (ref >60.00)
Glucose, Bld: 125 mg/dL — ABNORMAL HIGH (ref 70–99)
Potassium: 4.1 mEq/L (ref 3.5–5.1)
Sodium: 139 mEq/L (ref 135–145)
Total Bilirubin: 0.7 mg/dL (ref 0.2–1.2)
Total Protein: 7.4 g/dL (ref 6.0–8.3)

## 2022-12-24 LAB — HEMOGLOBIN A1C: Hgb A1c MFr Bld: 6.6 % — ABNORMAL HIGH (ref 4.6–6.5)

## 2022-12-24 LAB — LDL CHOLESTEROL, DIRECT: Direct LDL: 154 mg/dL

## 2022-12-24 NOTE — Patient Instructions (Signed)
Stop metformin and stop rosuvastatin  Diabetes Mellitus and Nutrition, Adult When you have diabetes, or diabetes mellitus, it is very important to have healthy eating habits because your blood sugar (glucose) levels are greatly affected by what you eat and drink. Eating healthy foods in the right amounts, at about the same times every day, can help you: Manage your blood glucose. Lower your risk of heart disease. Improve your blood pressure. Reach or maintain a healthy weight. What can affect my meal plan? Every person with diabetes is different, and each person has different needs for a meal plan. Your health care provider may recommend that you work with a dietitian to make a meal plan that is best for you. Your meal plan may vary depending on factors such as: The calories you need. The medicines you take. Your weight. Your blood glucose, blood pressure, and cholesterol levels. Your activity level. Other health conditions you have, such as heart or kidney disease. How do carbohydrates affect me? Carbohydrates, also called carbs, affect your blood glucose level more than any other type of food. Eating carbs raises the amount of glucose in your blood. It is important to know how many carbs you can safely have in each meal. This is different for every person. Your dietitian can help you calculate how many carbs you should have at each meal and for each snack. How does alcohol affect me? Alcohol can cause a decrease in blood glucose (hypoglycemia), especially if you use insulin or take certain diabetes medicines by mouth. Hypoglycemia can be a life-threatening condition. Symptoms of hypoglycemia, such as sleepiness, dizziness, and confusion, are similar to symptoms of having too much alcohol. Do not drink alcohol if: Your health care provider tells you not to drink. You are pregnant, may be pregnant, or are planning to become pregnant. If you drink alcohol: Limit how much you have to: 0-1  drink a day for women. 0-2 drinks a day for men. Know how much alcohol is in your drink. In the [place] one drink equals one 12 oz bottle of beer (355 mL), one 5 oz glass of wine (148 mL), or one 1 oz glass of hard liquor (44 mL). Keep yourself hydrated with water, diet soda, or unsweetened iced tea. Keep in mind that regular soda, juice, and other mixers may contain a lot of sugar and must be counted as carbs. What are tips for following this plan?  Reading food labels Start by checking the serving size on the Nutrition Facts label of packaged foods and drinks. The number of calories and the amount of carbs, fats, and other nutrients listed on the label are based on one serving of the item. Many items contain more than one serving per package. Check the total grams (g) of carbs in one serving. Check the number of grams of saturated fats and trans fats in one serving. Choose foods that have a low amount or none of these fats. Check the number of milligrams (mg) of salt (sodium) in one serving. Most people should limit total sodium intake to less than 2,300 mg per day. Always check the nutrition information of foods labeled as "low-fat" or "nonfat." These foods may be higher in added sugar or refined carbs and should be avoided. Talk to your dietitian to identify your daily goals for nutrients listed on the label. Shopping Avoid buying canned, pre-made, or processed foods. These foods tend to be high in fat, sodium, and added sugar. Shop around the outside edge of the  grocery store. This is where you will most often find fresh fruits and vegetables, bulk grains, fresh meats, and fresh dairy products. Cooking Use low-heat cooking methods, such as baking, instead of high-heat cooking methods, such as deep frying. Cook using healthy oils, such as olive, canola, or sunflower oil. Avoid cooking with butter, cream, or high-fat meats. Meal planning Eat meals and snacks regularly, preferably at the same  times every day. Avoid going long periods of time without eating. Eat foods that are high in fiber, such as fresh fruits, vegetables, beans, and whole grains. Eat 4-6 oz (112-168 g) of lean protein each day, such as lean meat, chicken, fish, eggs, or tofu. One ounce (oz) (28 g) of lean protein is equal to: 1 oz (28 g) of meat, chicken, or fish. 1 egg.  cup (62 g) of tofu. Eat some foods each day that contain healthy fats, such as avocado, nuts, seeds, and fish. What foods should I eat? Fruits Berries. Apples. Oranges. Peaches. Apricots. Plums. Grapes. Mangoes. Papayas. Pomegranates. Kiwi. Cherries. Vegetables Leafy greens, including lettuce, spinach, kale, chard, collard greens, mustard greens, and cabbage. Beets. Cauliflower. Broccoli. Carrots. Green beans. Tomatoes. Peppers. Onions. Cucumbers. Brussels sprouts. Grains Whole grains, such as whole-wheat or whole-grain bread, crackers, tortillas, cereal, and pasta. Unsweetened oatmeal. Quinoa. Brown or wild rice. Meats and other proteins Seafood. Poultry without skin. Lean cuts of poultry and beef. Tofu. Nuts. Seeds. Dairy Low-fat or fat-free dairy products such as milk, yogurt, and cheese. The items listed above may not be a complete list of foods and beverages you can eat and drink. Contact a dietitian for more information. What foods should I avoid? Fruits Fruits canned with syrup. Vegetables Canned vegetables. Frozen vegetables with butter or cream sauce. Grains Refined white flour and flour products such as bread, pasta, snack foods, and cereals. Avoid all processed foods. Meats and other proteins Fatty cuts of meat. Poultry with skin. Breaded or fried meats. Processed meat. Avoid saturated fats. Dairy Full-fat yogurt, cheese, or milk. Beverages Sweetened drinks, such as soda or iced tea. The items listed above may not be a complete list of foods and beverages you should avoid. Contact a dietitian for more information. Questions  to ask a health care provider Do I need to meet with a certified diabetes care and education specialist? Do I need to meet with a dietitian? What number can I call if I have questions? When are the best times to check my blood glucose? Where to find more information: American Diabetes Association: diabetes.org Academy of Nutrition and Dietetics: eatright[employer] of Diabetes and Digestive and Kidney Diseases: [email or web address] Association of Diabetes Care & Education Specialists: diabeteseducator.org Summary It is important to have healthy eating habits because your blood sugar (glucose) levels are greatly affected by what you eat and drink. It is important to use alcohol carefully. A healthy meal plan will help you manage your blood glucose and lower your risk of heart disease. Your health care provider may recommend that you work with a dietitian to make a meal plan that is best for you. This information is not intended to replace advice given to you by your health care provider. Make sure you discuss any questions you have with your health care provider. Document Revised: 11/18/2019 Document Reviewed: 11/18/2019 Elsevier Patient Education  2024 [employer].

## 2022-12-24 NOTE — Assessment & Plan Note (Signed)
Developed side effects to metformin and rosuvastatin Intolerant to these medications Diet and nutrition discussed Cardiovascular risk associated with dyslipidemia and diabetes discussed Blood work done today May not need medication.  Hopefully this could be diet controlled

## 2022-12-24 NOTE — Progress Notes (Signed)
William Dodson 72 y.o.   Chief Complaint  Patient presents with   Medication Reaction    Patient states he has been having some side effects to a medication. He states he is taking 2 medications and he is unsure which one is causing his issues  Patient states depression, anger, anxiety.  Patient states he stop taking the 2 medications 2 weeks ago, since then feels better.     HISTORY OF PRESENT ILLNESS: This is a 72 y.o. male recently diagnosed with diabetes, hemoglobin A1c is 7.1 Last June I started him on metformin 500 mg twice a day and rosuvastatin 10 mg daily Unable to tolerate medication.  Developed GI side effects but mostly depression Stopped 2 weeks ago and all symptoms are gone. Eating better. No other plaints or medical concerns today  HPI   Prior to Admission medications   Medication Sig Start Date End Date Taking? Authorizing Provider  b complex vitamins tablet Take 1 tablet by mouth daily.   Yes [provider]  BIOTIN PO Take 50 mcg by mouth daily.   Yes [provider]  Cholecalciferol (VITAMIN D3) 5000 units CAPS Take 5,000 Units by mouth daily.   Yes [provider]  CHOLINE PO Take 20 mg by mouth daily.   Yes [provider]  Chromium Picolinate (CHROMIUM PICOLATE PO) Take 200 mcg by mouth daily.   Yes [provider]  Folic Acid (FOLATE PO) Take 200 mcg by mouth daily.   Yes [provider]  ibuprofen (ADVIL,MOTRIN) 200 MG tablet Take 200-400 mg by mouth every 8 (eight) hours as needed for moderate pain.   Yes [provider]  INOSITOL PO Take 50 mg by mouth daily.   Yes [provider]  Boris Lown Oil 1000 MG CAPS Take 1,000 mg by mouth daily.   Yes [provider]  MAGNESIUM PO Take 500 mg by mouth daily.   Yes [provider]  Milk Thistle 175 MG CAPS Take 175 mg by mouth daily.   Yes [provider]  Misc Natural Products (SAW PALMETTO) CAPS Take 500 mg by mouth  daily.   Yes [provider]  Niacin (VITAMIN B-3 PO) Take 50 mg by mouth daily.   Yes [provider]  P-Aminobenzoic Acid (PABA) 100 MG TABS Take 25 mg by mouth daily.   Yes [provider]  Pantothenic Acid 250 MG CAPS Take 125 mg by mouth daily.   Yes [provider]  Riboflavin (VITAMIN B-2) 25 MG TABS Take 25 mg by mouth daily.   Yes [provider]  thiamine (VITAMIN B-1) 50 MG tablet Take 25 mg by mouth daily.   Yes [provider]  Ubiquinol 100 MG CAPS Take 100 mg by mouth daily.   Yes [provider]  vitamin A 40981 UNIT capsule Take 10,000 Units by mouth daily.   Yes [provider]  vitamin B-12 (CYANOCOBALAMIN) 250 MCG tablet Take 125 mcg by mouth daily.   Yes [provider]  vitamin B-6 (PYRIDOXINE) 25 MG tablet Take 25 mg by mouth daily.   Yes [provider]  vitamin C (ASCORBIC ACID) 500 MG tablet Take 500 mg by mouth daily.   Yes [provider]  meloxicam (MOBIC) 15 MG tablet Take 1 tablet (15 mg total) by mouth daily. Patient not taking: Reported on 12/24/2022 04/17/22   Georgina Quint, MD  metFORMIN (GLUCOPHAGE) 500 MG tablet Take 1 tablet (500 mg total) by mouth 2 (  two) times daily with a meal. Patient not taking: Reported on 12/24/2022 10/10/22   Georgina Quint, MD  rosuvastatin (CRESTOR) 10 MG tablet Take 1 tablet (10 mg total) by mouth daily. Patient not taking: Reported on 12/24/2022 10/10/22   Georgina Quint, MD    Allergies  Allergen Reactions   Morphine And Codeine Other (See Comments)    Increased hearing, hallucinations, weird feeling   Penicillins    Doxycycline Itching and Rash        Hydrocodone Itching and Rash   Septra [Bactrim] Itching and Rash         Patient Active Problem List   Diagnosis Date Noted   OSA on CPAP 11/13/2022   Proteinuria 10/10/2022   Glycosuria 10/10/2022   Dyslipidemia associated with type 2 diabetes  mellitus (HCC) 10/10/2022   Obstructive sleep apnea 06/28/2011   Sleep apnea 01/29/2011   Chronic systolic dysfunction of left ventricle 01/29/2011   ONYCHOMYCOSIS 06/27/2006   HYPERLIPIDEMIA 06/27/2006   ATRIAL FIBRILLATION 06/27/2006    Past Medical History:  Diagnosis Date   Atrial fibrillation (HCC)    persistent,  s/p PVI by Dr Delena Serve at [facility] 1990s   Myalgia    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[facility])    "heart infection" in setting of pneumonia 1993   Sleep apnea    recent sleep study, results pending    Past Surgical History:  Procedure Laterality Date   ATRIAL ABLATION SURGERY  1999   afib ablation 1990s at [facility] by Ronn Melena   VENTRAL HERNIA REPAIR N/A 08/24/2016   Procedure: LAPAROSCOPIC VENTRAL HERNIA REPAIR WITH MESH;  Surgeon: Berna Bue, MD;  Location: [facility] OR;  Service: General;  Laterality: N/A;  Laparoscopic Repair of Umbilical and Supraumbilical Hernia with Mesh     Social History   Socioeconomic History   Marital status: Married    Spouse name: Not on file   Number of children: Not on file   Years of education: Not on file   Highest education level: Professional school degree (e.g., MD, DDS, DVM, JD)  Occupational History   Occupation: Sculptor  Tobacco Use   Smoking status: Never   Smokeless tobacco: Never   Tobacco comments:    tried smoking 30 years ago  Vaping Use   Vaping status: Never Used  Substance and Sexual Activity   Alcohol use: Yes    Alcohol/week: 6.0 standard drinks of alcohol    Types: 6 Glasses of wine per week    Comment: 1 glass of wine at night   Drug use: No   Sexual activity: Not on file  Other Topics Concern   Not on file  Social History Narrative   Married   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[employer]   Two children also pursuing Art   Sedentary      Sculptor, primarily with iron   Social Determinants of Health   Financial Resource Strain: Low Risk  (09/04/2022)   Overall Financial Resource Strain (CARDIA)    Difficulty of Paying Living Expenses:  Not very hard  Food Insecurity: No Food Insecurity (09/04/2022)   Hunger Vital Sign    Worried About Running Out of Food in the Last Year: Never true    Ran Out of Food in the Last Year: Never true  Transportation Needs: No Transportation Needs (09/04/2022)   PRAPARE - Administrator, Civil Service (Medical): No    Lack of Transportation (Non-Medical): No  Physical Activity: Sufficiently Active (09/04/2022)   Exercise Vital Sign  Days of Exercise per Week: 5 days    Minutes of Exercise per Session: 90 min  Stress: Stress Concern Present (09/04/2022)   [employer] of Occupational Health - Occupational Stress Questionnaire    Feeling of Stress : To some extent  Social Connections: Socially Integrated (09/04/2022)   Social Connection and Isolation Panel [NHANES]    Frequency of Communication with Friends and Family: Once a week    Frequency of Social Gatherings with Friends and Family: More than three times a week    Attends Religious Services: More than 4 times per year    Active Member of [employer] or Organizations: Yes    Attends Banker Meetings: 1 to 4 times per year    Marital Status: Married  Catering manager Violence: Not At Risk (05/15/2022)   Humiliation, Afraid, Rape, and Kick questionnaire    Fear of Current or Ex-Partner: No    Emotionally Abused: No    Physically Abused: No    Sexually Abused: No    Family History  Problem Relation Age of Onset   Diabetes Mother    Cancer Mother    Mental illness Mother    Heart disease Father    Hyperlipidemia Father    Mental illness Brother    Diabetes Maternal Grandmother    Heart disease Paternal Grandmother      Review of Systems  Constitutional: Negative.  Negative for chills and fever.  HENT: Negative.  Negative for congestion and sore throat.   Respiratory: Negative.  Negative for cough and shortness of breath.   Cardiovascular: Negative.  Negative for chest pain and palpitations.   Gastrointestinal:  Negative for abdominal pain, diarrhea, nausea and vomiting.  Genitourinary: Negative.  Negative for dysuria and hematuria.  Skin: Negative.  Negative for rash.  Neurological: Negative.  Negative for dizziness and headaches.  All other systems reviewed and are negative.   Vitals:   12/24/22 0916  BP: 120/82  Pulse: 85  Temp: 98.5 F (36.9 C)  SpO2: 96%    Physical Exam Vitals reviewed.  Constitutional:      Appearance: Normal appearance.  HENT:     Head: Normocephalic.  Eyes:     Extraocular Movements: Extraocular movements intact.     Pupils: Pupils are equal, round, and reactive to light.  Cardiovascular:     Rate and Rhythm: Normal rate and regular rhythm.     Pulses: Normal pulses.     Heart sounds: Normal heart sounds.  Pulmonary:     Effort: Pulmonary effort is normal.     Breath sounds: Normal breath sounds.  Musculoskeletal:     Cervical back: No tenderness.  Lymphadenopathy:     Cervical: No cervical adenopathy.  Skin:    General: Skin is warm and dry.     Capillary Refill: Capillary refill takes less than 2 seconds.  Neurological:     General: No focal deficit present.     Mental Status: He is alert and oriented to person, place, and time.  Psychiatric:        Mood and Affect: Mood normal.        Behavior: Behavior normal.    Wt Readings from Last 3 Encounters:  12/24/22 220 lb 4 oz (99.9 kg)  11/13/22 219 lb (99.3 kg)  10/10/22 224 lb 2 oz (101.7 kg)      ASSESSMENT & PLAN: A total of 40 minutes was spent with the patient and counseling/coordination of care regarding preparing for this visit, review of  most recent office visit notes, review of most recent blood work results, review of all medications, cardiovascular risks associated with dyslipidemia and diabetes, education on nutrition, prognosis, documentation, and need for follow-up.  Problem List Items Addressed This Visit       Endocrine   Dyslipidemia associated with  type 2 diabetes mellitus (HCC) - Primary    Developed side effects to metformin and rosuvastatin Intolerant to these medications Diet and nutrition discussed Cardiovascular risk associated with dyslipidemia and diabetes discussed Blood work done today May not need medication.  Hopefully this could be diet controlled      Relevant Orders   Comprehensive metabolic panel   Hemoglobin A1c   Lipid panel   Other Visit Diagnoses     Need for vaccination       Relevant Orders   Flu Vaccine Trivalent High Dose (Fluad)      Patient Instructions  Stop metformin and stop rosuvastatin  Diabetes Mellitus and Nutrition, Adult When you have diabetes, or diabetes mellitus, it is very important to have healthy eating habits because your blood sugar (glucose) levels are greatly affected by what you eat and drink. Eating healthy foods in the right amounts, at about the same times every day, can help you: Manage your blood glucose. Lower your risk of heart disease. Improve your blood pressure. Reach or maintain a healthy weight. What can affect my meal plan? Every person with diabetes is different, and each person has different needs for a meal plan. Your health care provider may recommend that you work with a dietitian to make a meal plan that is best for you. Your meal plan may vary depending on factors such as: The calories you need. The medicines you take. Your weight. Your blood glucose, blood pressure, and cholesterol levels. Your activity level. Other health conditions you have, such as heart or kidney disease. How do carbohydrates affect me? Carbohydrates, also called carbs, affect your blood glucose level more than any other type of food. Eating carbs raises the amount of glucose in your blood. It is important to know how many carbs you can safely have in each meal. This is different for every person. Your dietitian can help you calculate how many carbs you should have at each meal and  for each snack. How does alcohol affect me? Alcohol can cause a decrease in blood glucose (hypoglycemia), especially if you use insulin or take certain diabetes medicines by mouth. Hypoglycemia can be a life-threatening condition. Symptoms of hypoglycemia, such as sleepiness, dizziness, and confusion, are similar to symptoms of having too much alcohol. Do not drink alcohol if: Your health care provider tells you not to drink. You are pregnant, may be pregnant, or are planning to become pregnant. If you drink alcohol: Limit how much you have to: 0-1 drink a day for women. 0-2 drinks a day for men. Know how much alcohol is in your drink. In the [place] one drink equals one 12 oz bottle of beer (355 mL), one 5 oz glass of wine (148 mL), or one 1 oz glass of hard liquor (44 mL). Keep yourself hydrated with water, diet soda, or unsweetened iced tea. Keep in mind that regular soda, juice, and other mixers may contain a lot of sugar and must be counted as carbs. What are tips for following this plan?  Reading food labels Start by checking the serving size on the Nutrition Facts label of packaged foods and drinks. The number of calories and the amount of carbs,  fats, and other nutrients listed on the label are based on one serving of the item. Many items contain more than one serving per package. Check the total grams (g) of carbs in one serving. Check the number of grams of saturated fats and trans fats in one serving. Choose foods that have a low amount or none of these fats. Check the number of milligrams (mg) of salt (sodium) in one serving. Most people should limit total sodium intake to less than 2,300 mg per day. Always check the nutrition information of foods labeled as "low-fat" or "nonfat." These foods may be higher in added sugar or refined carbs and should be avoided. Talk to your dietitian to identify your daily goals for nutrients listed on the label. Shopping Avoid buying canned,  pre-made, or processed foods. These foods tend to be high in fat, sodium, and added sugar. Shop around the outside edge of the grocery store. This is where you will most often find fresh fruits and vegetables, bulk grains, fresh meats, and fresh dairy products. Cooking Use low-heat cooking methods, such as baking, instead of high-heat cooking methods, such as deep frying. Cook using healthy oils, such as olive, canola, or sunflower oil. Avoid cooking with butter, cream, or high-fat meats. Meal planning Eat meals and snacks regularly, preferably at the same times every day. Avoid going long periods of time without eating. Eat foods that are high in fiber, such as fresh fruits, vegetables, beans, and whole grains. Eat 4-6 oz (112-168 g) of lean protein each day, such as lean meat, chicken, fish, eggs, or tofu. One ounce (oz) (28 g) of lean protein is equal to: 1 oz (28 g) of meat, chicken, or fish. 1 egg.  cup (62 g) of tofu. Eat some foods each day that contain healthy fats, such as avocado, nuts, seeds, and fish. What foods should I eat? Fruits Berries. Apples. Oranges. Peaches. Apricots. Plums. Grapes. Mangoes. Papayas. Pomegranates. Kiwi. Cherries. Vegetables Leafy greens, including lettuce, spinach, kale, chard, collard greens, mustard greens, and cabbage. Beets. Cauliflower. Broccoli. Carrots. Green beans. Tomatoes. Peppers. Onions. Cucumbers. Brussels sprouts. Grains Whole grains, such as whole-wheat or whole-grain bread, crackers, tortillas, cereal, and pasta. Unsweetened oatmeal. Quinoa. Brown or wild rice. Meats and other proteins Seafood. Poultry without skin. Lean cuts of poultry and beef. Tofu. Nuts. Seeds. Dairy Low-fat or fat-free dairy products such as milk, yogurt, and cheese. The items listed above may not be a complete list of foods and beverages you can eat and drink. Contact a dietitian for more information. What foods should I avoid? Fruits Fruits canned with  syrup. Vegetables Canned vegetables. Frozen vegetables with butter or cream sauce. Grains Refined white flour and flour products such as bread, pasta, snack foods, and cereals. Avoid all processed foods. Meats and other proteins Fatty cuts of meat. Poultry with skin. Breaded or fried meats. Processed meat. Avoid saturated fats. Dairy Full-fat yogurt, cheese, or milk. Beverages Sweetened drinks, such as soda or iced tea. The items listed above may not be a complete list of foods and beverages you should avoid. Contact a dietitian for more information. Questions to ask a health care provider Do I need to meet with a certified diabetes care and education specialist? Do I need to meet with a dietitian? What number can I call if I have questions? When are the best times to check my blood glucose? Where to find more information: American Diabetes Association: diabetes.org Academy of Nutrition and Dietetics: eatright.[employer] of Diabetes and  Digestive and Kidney Diseases: StageSync.si Association of Diabetes Care & Education Specialists: diabeteseducator.org Summary It is important to have healthy eating habits because your blood sugar (glucose) levels are greatly affected by what you eat and drink. It is important to use alcohol carefully. A healthy meal plan will help you manage your blood glucose and lower your risk of heart disease. Your health care provider may recommend that you work with a dietitian to make a meal plan that is best for you. This information is not intended to replace advice given to you by your health care provider. Make sure you discuss any questions you have with your health care provider. Document Revised: 11/18/2019 Document Reviewed: 11/18/2019 Elsevier Patient Education  2024 Elsevier Inc.    Edwina Barth, MD [facility] at [facility]

## 2022-12-26 NOTE — Procedures (Signed)
[facility]  [name] "William Dodson" Male, 72 y.o., 14-Dec-1950 MRN: [id number]   HOME SLEEP TEST REPORT ( by Watch PAT)   STUDY DATE:  12-19-2022   ORDERING CLINICIAN: Melvyn Novas, MD  REFERRING CLINICIAN: Dr Alvy Bimler   CLINICAL INFORMATION/HISTORY: William Dodson is a 72 y.o. male patient who is seen upon referral on 11/13/2022 from Dr Natale Milch for a new evaluation. he has congestive HF, chronic systolic dysfunction. 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 He has been using his CPAP which 10 years or older. He is originally for  [place], [place]. The patient had the first sleep study in the year 2012- Here at [facility] sleep,  with a result of an AHI ( Apnea Hypopnea index)  of 35/h ,  REM AHI was 61.4/h and  supine AHI was 62/h.with  an oxygen saturation Nadir at [id number] for 8 minutes total desat time.   When I first met Mr William Dodson, he was 72 yo with longstanding atrial fibrillation.  Referred by Dr Merla Riches.  Afib ablation at [facility] 19 96 with Ronn Melena ,MD.  Last seen by Dr Shelva Majestic.  Describes having normal  results by cath and normal stress tests in past.  Indicates severe episode of inflamation of heart over 20 years ago  ( 1994) that started  as a pneumonia a with final myocarditis and endocarditis.      Epworth sleepiness score: 02/ 24 points   FSS endorsed at 35/ 63 points.    BMI: 37.3 kg/m   Neck Circumference: 17"   FINDINGS:   Sleep Summary:   Total Recording Time (hours, min): 9 hours 7 minutes      Total Sleep Time (hours, min):   7 hours 29 minutes              Percent REM (%): 21.5%                                      Respiratory Indices by CMS criteria:   Calculated pAHI (per hour):   25.5/h by AASM criteria and 15.4/h by CMS criteria                          REM pAHI:    23.4/h                                              NREM pAHI: 13.2/h                            Positional AHI: The patient slept for most of the night in supine position with 282 minutes with an AHI of 14.8/h following CMS criteria.  Snoring statistics show a mean volume of snoring at 41 dB  Oxygen Saturation Statistics:   Oxygen Saturation (%) Mean:   Between the nadir at 87 and a maximum of 99% with a mean saturation of 94%                               O2 Saturation (minutes) <89%: 0.1 minutes         Pulse Rate Statistics:   Pulse Mean (bpm):   81 bpm              Pulse Range:     Between 60 and 131 bpm            IMPRESSION:  This HST confirms the presence of moderate sleep apnea following CMS criteria and AASM criteria.  No central apneas were detected by this home sleep test device.  I strongly recommend to continue with CPAP use given his history is a patient with atrial fibrillation with failed ablation and failed cardioversion history.  He is not anticoagulated due to his occupational risks.   RECOMMENDATION: Autotitration CPAP device, to replace his current CPAP which was issued in 2012. Auto titrating device by [employer] with a setting between 5 and 13 cm water pressure 2 cm EPR, the new mask has to be fitted this patient has a lot of air leakage and also has facial hair.      INTERPRETING PHYSICIAN:   Melvyn Novas, MD

## 2022-12-26 NOTE — Addendum Note (Signed)
Addended by: Melvyn Novas on: 12/26/2022 06:49 PM   Modules accepted: Orders

## 2022-12-27 ENCOUNTER — Telehealth: Payer: Self-pay

## 2022-12-27 ENCOUNTER — Telehealth: Payer: Self-pay | Admitting: Neurology

## 2022-12-27 NOTE — Telephone Encounter (Signed)
-----   Message from Nurse Baird Lyons B sent at 12/27/2022  9:13 AM EDT -----  ----- Message ----- From: Melvyn Novas, MD Sent: 12/26/2022   6:49 PM EDT To: Gna-[facility] 3 Results  This HST confirms the presence of moderate sleep apnea ( OSA ) following CMS criteria and AASM criteria.  No central apneas were detected by this home sleep test device.  I strongly recommend to continue with CPAP use given his history as a patient with atrial fibrillation (with failed ablation and failed cardioversion) .  He is not anticoagulated due to his occupational risks.

## 2022-12-27 NOTE — Telephone Encounter (Signed)
Pt is asking for a call with results to sleep study

## 2022-12-27 NOTE — Telephone Encounter (Signed)
Order sent to [facility]

## 2022-12-27 NOTE — Telephone Encounter (Signed)
See previous phone note started by sleep lab

## 2022-12-27 NOTE — Telephone Encounter (Signed)
LVM for patient to callback to go over the results of his home sleep study.

## 2022-12-27 NOTE — Telephone Encounter (Signed)
Per phone staff, pt called back, Pt is asking for a call with results to sleep study

## 2022-12-27 NOTE — Telephone Encounter (Signed)
Spoke with patient about his recent home sleep study. Pt is a current CPAP user for 12+years. Pt is agreeable to receive his new CPAP machine. Pt is currently with [facility]. Pt had no additional questions or concerns at this time. Pt will be on the lookout for a call from his DME and also our office to schedule a follow up appt after set-up.

## 2023-01-14 DIAGNOSIS — G4733 Obstructive sleep apnea (adult) (pediatric): Secondary | ICD-10-CM | POA: Diagnosis not present

## 2023-01-29 DIAGNOSIS — H43811 Vitreous degeneration, right eye: Secondary | ICD-10-CM | POA: Diagnosis not present

## 2023-01-29 DIAGNOSIS — E119 Type 2 diabetes mellitus without complications: Secondary | ICD-10-CM | POA: Diagnosis not present

## 2023-01-29 DIAGNOSIS — H43392 Other vitreous opacities, left eye: Secondary | ICD-10-CM | POA: Diagnosis not present

## 2023-01-29 DIAGNOSIS — H2513 Age-related nuclear cataract, bilateral: Secondary | ICD-10-CM | POA: Diagnosis not present

## 2023-01-29 LAB — HM DIABETES EYE EXAM

## 2023-02-13 DIAGNOSIS — G4733 Obstructive sleep apnea (adult) (pediatric): Secondary | ICD-10-CM | POA: Diagnosis not present

## 2023-03-16 DIAGNOSIS — G4733 Obstructive sleep apnea (adult) (pediatric): Secondary | ICD-10-CM | POA: Diagnosis not present

## 2023-03-29 ENCOUNTER — Encounter: Payer: Self-pay | Admitting: Emergency Medicine

## 2023-03-29 NOTE — Telephone Encounter (Signed)
 Care team updated and letter sent for eye exam notes.

## 2023-04-15 DIAGNOSIS — G4733 Obstructive sleep apnea (adult) (pediatric): Secondary | ICD-10-CM | POA: Diagnosis not present

## 2023-05-16 DIAGNOSIS — G4733 Obstructive sleep apnea (adult) (pediatric): Secondary | ICD-10-CM | POA: Diagnosis not present

## 2023-06-16 DIAGNOSIS — G4733 Obstructive sleep apnea (adult) (pediatric): Secondary | ICD-10-CM | POA: Diagnosis not present

## 2023-06-26 ENCOUNTER — Ambulatory Visit (INDEPENDENT_AMBULATORY_CARE_PROVIDER_SITE_OTHER): Payer: Medicare PPO

## 2023-06-26 VITALS — Ht 64.5 in | Wt 215.0 lb

## 2023-06-26 DIAGNOSIS — Z0000 Encounter for general adult medical examination without abnormal findings: Secondary | ICD-10-CM

## 2023-06-26 DIAGNOSIS — E1169 Type 2 diabetes mellitus with other specified complication: Secondary | ICD-10-CM

## 2023-06-26 DIAGNOSIS — Z1211 Encounter for screening for malignant neoplasm of colon: Secondary | ICD-10-CM

## 2023-06-26 DIAGNOSIS — Z23 Encounter for immunization: Secondary | ICD-10-CM

## 2023-06-26 DIAGNOSIS — Z1212 Encounter for screening for malignant neoplasm of rectum: Secondary | ICD-10-CM | POA: Diagnosis not present

## 2023-06-26 DIAGNOSIS — E785 Hyperlipidemia, unspecified: Secondary | ICD-10-CM | POA: Diagnosis not present

## 2023-06-26 NOTE — Patient Instructions (Signed)
 William Dodson , Thank you for taking time to come for your Medicare Wellness Visit. I appreciate your ongoing commitment to your health goals. Please review the following plan we discussed and let me know if I can assist you in the future.   Referrals/Orders/Follow-Ups/Clinician Recommendations: It was nice to talk with you today.  Keep up the good work.  I have placed an order for you to receive a Cologuard kit at your home.   This is a list of the screening recommended for you and due dates:  Health Maintenance  Topic Date Due   Complete foot exam   Never done   Hepatitis C Screening  Never done   Zoster (Shingles) Vaccine (1 of 2) Never done   Flu Shot  11/29/2022   COVID-19 Vaccine (7 - 2024-25 season) 12/30/2022   Medicare Annual Wellness Visit  05/16/2023   Hemoglobin A1C  06/26/2023   Yearly kidney health urinalysis for diabetes  10/10/2023   Yearly kidney function blood test for diabetes  12/24/2023   Eye exam for diabetics  01/29/2024   Colon Cancer Screening  05/01/2027   Pneumonia Vaccine  Completed   HPV Vaccine  Aged Out   DTaP/Tdap/Td vaccine  Discontinued    Advanced directives: (Copy Requested) Please bring a copy of your health care power of attorney and living will to the office to be added to your chart at your convenience.  Next Medicare Annual Wellness Visit scheduled for next year: Yes

## 2023-06-26 NOTE — Progress Notes (Signed)
 Subjective:   STEPHON Dodson is a 73 y.o. who presents for a Medicare Wellness preventive visit.  Visit Complete: Virtual I connected with  William Dodson on 06/26/23 by a video and audio enabled telemedicine application and verified that I am speaking with the correct person using two identifiers.  Patient Location: Home  Provider Location: Home Office  I discussed the limitations of evaluation and management by telemedicine. The patient expressed understanding and agreed to proceed.  Vital Signs: Because this visit was a virtual/telehealth visit, some criteria may be missing or patient reported. Any vitals not documented were not able to be obtained and vitals that have been documented are patient reported.   AWV Questionnaire: No: Patient Medicare AWV questionnaire was not completed prior to this visit.  Cardiac Risk Factors include: advanced age (>45men, >85 women);male gender;Other (see comment);dyslipidemia;diabetes mellitus, Risk factor comments: OSA, A-fib     Objective:    Today's Vitals   06/26/23 1544  Weight: 215 lb (97.5 kg)  Height: 5' 4.5" (1.638 m)   Body mass index is 36.33 kg/m.     06/26/2023    3:58 PM 05/15/2022   11:51 AM 08/24/2016    6:49 AM 07/07/2016    4:48 PM 07/22/2015    4:35 PM  Advanced Directives  Does Patient Have a Medical Advance Directive? Yes Yes Yes No No;Yes  Type of Estate agent of [place];Living will Healthcare Power of [place];Living will Healthcare Power of [place];Living will  Healthcare Power of Attorney  Does patient want to make changes to medical advance directive?   No - Patient declined    Copy of Healthcare Power of Attorney in Chart? No - copy requested No - copy requested No - copy requested      Current Medications (verified) Outpatient Encounter Medications as of 06/26/2023  Medication Sig   b complex vitamins tablet Take 1 tablet by mouth daily.   BIOTIN PO Take 50 mcg by mouth daily.    Cholecalciferol (VITAMIN D3) 5000 units CAPS Take 5,000 Units by mouth daily.   CHOLINE PO Take 20 mg by mouth daily.   Chromium Picolinate (CHROMIUM PICOLATE PO) Take 200 mcg by mouth daily.   Folic Acid (FOLATE PO) Take 200 mcg by mouth daily.   ibuprofen (ADVIL,MOTRIN) 200 MG tablet Take 200-400 mg by mouth every 8 (eight) hours as needed for moderate pain.   INOSITOL PO Take 50 mg by mouth daily.   Krill Oil 1000 MG CAPS Take 1,000 mg by mouth daily.   MAGNESIUM PO Take 500 mg by mouth daily.   Milk Thistle 175 MG CAPS Take 175 mg by mouth daily.   Misc Natural Products (SAW PALMETTO) CAPS Take 500 mg by mouth daily.   Niacin (VITAMIN B-3 PO) Take 50 mg by mouth daily.   P-Aminobenzoic Acid (PABA) 100 MG TABS Take 25 mg by mouth daily.   Pantothenic Acid 250 MG CAPS Take 125 mg by mouth daily.   Riboflavin (VITAMIN B-2) 25 MG TABS Take 25 mg by mouth daily.   thiamine (VITAMIN B-1) 50 MG tablet Take 25 mg by mouth daily.   Ubiquinol 100 MG CAPS Take 100 mg by mouth daily.   vitamin A 40981 UNIT capsule Take 10,000 Units by mouth daily.   vitamin B-12 (CYANOCOBALAMIN) 250 MCG tablet Take 125 mcg by mouth daily.   vitamin B-6 (PYRIDOXINE) 25 MG tablet Take 25 mg by mouth daily.   vitamin C (ASCORBIC ACID) 500 MG tablet  Take 500 mg by mouth daily.   meloxicam (MOBIC) 15 MG tablet Take 1 tablet (15 mg total) by mouth daily. (Patient not taking: Reported on 06/26/2023)   metFORMIN (GLUCOPHAGE) 500 MG tablet Take 1 tablet (500 mg total) by mouth 2 (two) times daily with a meal. (Patient not taking: Reported on 12/24/2022)   rosuvastatin (CRESTOR) 10 MG tablet Take 1 tablet (10 mg total) by mouth daily. (Patient not taking: Reported on 06/26/2023)   No facility-administered encounter medications on file as of 06/26/2023.    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[facility])    "heart infection" in setting of pneumonia 1993   Sleep apnea    recent sleep study, results pending   Past Surgical History:  Procedure Laterality Date   ATRIAL ABLATION SURGERY  1999   afib ablation 1990s at [facility] by Ronn Melena   VENTRAL HERNIA REPAIR N/A 08/24/2016   Procedure: LAPAROSCOPIC VENTRAL HERNIA REPAIR WITH MESH;  Surgeon: Berna Bue, MD;  Location: [facility] OR;  Service: General;  Laterality: N/A;  Laparoscopic Repair of Umbilical and Supraumbilical Hernia with Mesh    Family History  Problem Relation Age of Onset   Diabetes Mother    Cancer Mother    Mental illness Mother    Heart disease Father    Hyperlipidemia Father    Mental illness Brother    Diabetes Maternal Grandmother    Heart disease Paternal Grandmother    Social History   Socioeconomic History   Marital status: Married    Spouse name: William Dodson   Number of children: 2   Years of education: Not on file   Highest education level: Professional school degree (e.g., MD, DDS, DVM, JD)  Occupational History   Occupation: Sculptor  Tobacco Use   Smoking status: Never   Smokeless tobacco: Never   Tobacco comments:    tried smoking 30 years ago  Vaping Use   Vaping status: Never Used  Substance and Sexual Activity   Alcohol use: Yes    Alcohol/week: 6.0 standard drinks of alcohol    Types: 6 Glasses of wine per week    Comment: 1 glass of wine at night   Drug use: No   Sexual activity: Not on file  Other Topics Concern   Not on file  Social History Narrative   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  2 Children, 2 grandchildren   Social Drivers of Corporate investment banker Strain: Low Risk  (06/26/2023)   Overall Financial Resource Strain (CARDIA)    Difficulty of  Paying Living Expenses: Not hard at all  Food Insecurity: No Food Insecurity (06/26/2023)   Hunger Vital Sign    Worried About Running Out of Food in the Last Year: Never true    Ran Out of Food in the Last Year: Never true  Transportation Needs: No Transportation Needs (06/26/2023)   PRAPARE - Administrator, Civil Service (Medical): No    Lack of Transportation (Non-Medical): No  Physical Activity: Sufficiently Active (06/26/2023)   Exercise Vital Sign    Days of Exercise per Week: 5 days    Minutes of Exercise  per Session: 60 min  Stress: No Stress Concern Present (06/26/2023)   [employer] of Occupational Health - Occupational Stress Questionnaire    Feeling of Stress : Not at all  Social Connections: Moderately Integrated (06/26/2023)   Social Connection and Isolation Panel [NHANES]    Frequency of Communication with Friends and Family: Once a week    Frequency of Social Gatherings with Friends and Family: Once a week    Attends Religious Services: More than 4 times per year    Active Member of [employer] or Organizations: Yes    Attends Banker Meetings: Never    Marital Status: Married    Tobacco Counseling Counseling given: Not Answered Tobacco comments: tried smoking 30 years ago    Clinical Intake:  Pre-visit preparation completed: Yes  Pain : No/denies pain     BMI - recorded: 36.33 Nutritional Status: BMI > 30  Obese Nutritional Risks: None Diabetes: Yes CBG done?: Yes (140 am/ 127 pm) CBG resulted in Enter/ Edit results?: No Did pt. bring in CBG monitor from home?: No  How often do you need to have someone help you when you read instructions, pamphlets, or other written materials from your doctor or pharmacy?: 1 - Never  Interpreter Needed?: No  Information entered by :: Leelah Hanna, RMA   Activities of Daily Living     06/26/2023    3:45 PM  In your present state of health, do you have any difficulty performing the  following activities:  Hearing? 0  Vision? 0  Difficulty concentrating or making decisions? 0  Walking or climbing stairs? 0  Dressing or bathing? 0  Doing errands, shopping? 0  Preparing Food and eating ? N  Using the Toilet? N  In the past six months, have you accidently leaked urine? Y  Do you have problems with loss of bowel control? N  Managing your Medications? N  Managing your Finances? N  Housekeeping or managing your Housekeeping? N    Patient Care Team: Georgina Quint, MD as PCP - General (Internal Medicine) Regan Lemming, MD as PCP - Cardiology (Cardiology) Rodrigo Ran, [place] as Consulting Physician (Ophthalmology) Judi Saa, DO as Consulting Physician (Sports Medicine)  Indicate any recent Medical Services you may have received from other than [facility] providers in the past year (date may be approximate).     Assessment:   This is a routine wellness examination for William Dodson.  Hearing/Vision screen Hearing Screening - Comments:: Denies hearing difficulties   Vision Screening - Comments:: Wears eyeglasses   Goals Addressed             This Visit's Progress    Patient Stated       Lose some weight 30-40 lb.       Depression Screen     06/26/2023    4:05 PM 12/24/2022    9:17 AM 10/10/2022    8:06 AM 09/05/2022    8:47 AM 05/15/2022   11:54 AM 04/17/2022    8:52 AM 01/26/2021    2:31 PM  PHQ 2/9 Scores  PHQ - 2 Score 0 1 0 0 0 0 0  PHQ- 9 Score 0 6         Fall Risk     06/26/2023    3:58 PM 12/24/2022    9:17 AM 10/10/2022    8:06 AM 09/05/2022    8:47 AM 05/15/2022   11:52 AM  Fall Risk   Falls in the past year? 0 0  0 0 0  Number falls in past yr: 0 0 0 0 0  Injury with Fall? 0 0 0  0  Risk for fall due to : No Fall Risks No Fall Risks No Fall Risks No Fall Risks No Fall Risks  Follow up Falls prevention discussed;Falls evaluation completed Falls evaluation completed Falls evaluation completed Falls evaluation completed Falls  prevention discussed    MEDICARE RISK AT HOME:  Medicare Risk at Home Any stairs in or around the home?: Yes If so, are there any without handrails?: Yes Home free of loose throw rugs in walkways, pet beds, electrical cords, etc?: Yes Adequate lighting in your home to reduce risk of falls?: Yes Life alert?: No Use of a cane, walker or w/c?: No Grab bars in the bathroom?: Yes Shower chair or bench in shower?: Yes Elevated toilet seat or a handicapped toilet?: Yes  TIMED UP AND GO:  Was the test performed?  No  Cognitive Function: 6CIT completed        06/26/2023    3:58 PM 05/15/2022   12:09 PM  6CIT Screen  What Year? 0 points 0 points  What month? 0 points 0 points  What time? 0 points 0 points  Count back from 20 0 points 0 points  Months in reverse 0 points 0 points  Repeat phrase 0 points 0 points  Total Score 0 points 0 points    Immunizations Immunization History  Administered Date(s) Administered   Fluad Quad(high Dose 65+) 04/17/2022   Influenza-Unspecified 04/17/2022   PFIZER(Purple Top)SARS-COV-2 Vaccination 07/02/2019, 07/28/2019, 02/08/2020, 08/30/2020, 02/13/2021   PNEUMOCOCCAL CONJUGATE-20 04/17/2022   Pfizer(Comirnaty)Fall Seasonal Vaccine 12 years and older 04/26/2022    Screening Tests Health Maintenance  Topic Date Due   FOOT EXAM  Never done   Hepatitis C Screening  Never done   Zoster Vaccines- Shingrix (1 of 2) Never done   INFLUENZA VACCINE  11/29/2022   COVID-19 Vaccine (7 - 2024-25 season) 12/30/2022   Medicare Annual Wellness (AWV)  05/16/2023   HEMOGLOBIN A1C  06/26/2023   Diabetic kidney evaluation - Urine ACR  10/10/2023   Diabetic kidney evaluation - eGFR measurement  12/24/2023   OPHTHALMOLOGY EXAM  01/29/2024   Colonoscopy  05/01/2027   Pneumonia Vaccine 40+ Years old  Completed   HPV VACCINES  Aged Out   DTaP/Tdap/Td  Discontinued    Health Maintenance  Health Maintenance Due  Topic Date Due   FOOT EXAM  Never done    Hepatitis C Screening  Never done   Zoster Vaccines- Shingrix (1 of 2) Never done   INFLUENZA VACCINE  11/29/2022   COVID-19 Vaccine (7 - 2024-25 season) 12/30/2022   Medicare Annual Wellness (AWV)  05/16/2023   HEMOGLOBIN A1C  06/26/2023   Health Maintenance Items Addressed: Cologuard Ordered  Additional Screening:  Vision Screening: Recommended annual ophthalmology exams for early detection of glaucoma and other disorders of the eye.  Dental Screening: Recommended annual dental exams for proper oral hygiene  Community Resource Referral / Chronic Care Management: CRR required this visit?  No   CCM required this visit?  No     Plan:     I have personally reviewed and noted the following in the patient's chart:   Medical and social history Use of alcohol, tobacco or illicit drugs  Current medications and supplements including opioid prescriptions. Patient is not currently taking opioid prescriptions. Functional ability and status Nutritional status Physical activity Advanced directives List of other physicians Hospitalizations, surgeries, and  ER visits in previous 12 months Vitals Screenings to include cognitive, depression, and falls Referrals and appointments  In addition, I have reviewed and discussed with patient certain preventive protocols, quality metrics, and best practice recommendations. A written personalized care plan for preventive services as well as general preventive health recommendations were provided to patient.     Martinez Boxx L Homer Pfeifer, CMA   06/26/2023   After Visit Summary: (MyChart) Due to this being a telephonic visit, the after visit summary with patients personalized plan was offered to patient via MyChart   Notes: Please refer to Routing Comments.

## 2023-07-01 DIAGNOSIS — Z1212 Encounter for screening for malignant neoplasm of rectum: Secondary | ICD-10-CM | POA: Diagnosis not present

## 2023-07-01 DIAGNOSIS — Z1211 Encounter for screening for malignant neoplasm of colon: Secondary | ICD-10-CM | POA: Diagnosis not present

## 2023-07-06 LAB — COLOGUARD: COLOGUARD: NEGATIVE

## 2023-07-14 DIAGNOSIS — G4733 Obstructive sleep apnea (adult) (pediatric): Secondary | ICD-10-CM | POA: Diagnosis not present

## 2023-07-23 ENCOUNTER — Encounter: Payer: Self-pay | Admitting: Emergency Medicine

## 2023-07-23 ENCOUNTER — Ambulatory Visit (INDEPENDENT_AMBULATORY_CARE_PROVIDER_SITE_OTHER): Admitting: Emergency Medicine

## 2023-07-23 VITALS — BP 128/88 | HR 81 | Temp 98.5°F | Ht 64.5 in | Wt 220.0 lb

## 2023-07-23 DIAGNOSIS — E785 Hyperlipidemia, unspecified: Secondary | ICD-10-CM

## 2023-07-23 DIAGNOSIS — E1169 Type 2 diabetes mellitus with other specified complication: Secondary | ICD-10-CM

## 2023-07-23 DIAGNOSIS — G4733 Obstructive sleep apnea (adult) (pediatric): Secondary | ICD-10-CM

## 2023-07-23 DIAGNOSIS — Z23 Encounter for immunization: Secondary | ICD-10-CM

## 2023-07-23 DIAGNOSIS — I480 Paroxysmal atrial fibrillation: Secondary | ICD-10-CM

## 2023-07-23 DIAGNOSIS — I519 Heart disease, unspecified: Secondary | ICD-10-CM

## 2023-07-23 LAB — POCT GLYCOSYLATED HEMOGLOBIN (HGB A1C): Hemoglobin A1C: 6.4 % — AB (ref 4.0–5.6)

## 2023-07-23 NOTE — Assessment & Plan Note (Signed)
 Developed side effects to metformin and rosuvastatin Intolerant to these medications Diet and nutrition discussed Cardiovascular risk associated with dyslipidemia and diabetes discussed Hemoglobin A1c 6.4 today diet controlled. No need for medication at present time.

## 2023-07-23 NOTE — Assessment & Plan Note (Signed)
 Normal sinus rhythm today. Not on any medication

## 2023-07-23 NOTE — Progress Notes (Signed)
 William Dodson 73 y.o.   Chief Complaint  Patient presents with   Follow-up    6 month f/u for DM. Pt has a DOT physical coming soon and just wants to see where his sugar is. He is monitoring his levels daily     HISTORY OF PRESENT ILLNESS: This is a 73 y.o. male here for 51-month follow-up of multiple chronic medical conditions Overall doing well.  Has no complaints or medical concerns today. Lab Results  Component Value Date   HGBA1C 6.6 (H) 12/24/2022     HPI   Prior to Admission medications   Medication Sig Start Date End Date Taking? Authorizing Provider  b complex vitamins tablet Take 1 tablet by mouth daily.   Yes [provider]  BIOTIN PO Take 50 mcg by mouth daily.   Yes [provider]  Cholecalciferol (VITAMIN D3) 5000 units CAPS Take 5,000 Units by mouth daily.   Yes [provider]  CHOLINE PO Take 20 mg by mouth daily.   Yes [provider]  Chromium Picolinate (CHROMIUM PICOLATE PO) Take 200 mcg by mouth daily.   Yes [provider]  Folic Acid (FOLATE PO) Take 200 mcg by mouth daily.   Yes [provider]  ibuprofen (ADVIL,MOTRIN) 200 MG tablet Take 200-400 mg by mouth every 8 (eight) hours as needed for moderate pain.   Yes [provider]  INOSITOL PO Take 50 mg by mouth daily.   Yes [provider]  Boris Lown Oil 1000 MG CAPS Take 1,000 mg by mouth daily.   Yes [provider]  MAGNESIUM PO Take 500 mg by mouth daily.   Yes [provider]  Milk Thistle 175 MG CAPS Take 175 mg by mouth daily.   Yes [provider]  Misc Natural Products (SAW PALMETTO) CAPS Take 500 mg by mouth daily.   Yes [provider]  Niacin (VITAMIN B-3 PO) Take 50 mg by mouth daily.   Yes [provider]  P-Aminobenzoic Acid (PABA) 100 MG TABS Take 25 mg by mouth daily.   Yes [provider]  Pantothenic Acid 250 MG CAPS Take 125 mg by mouth daily.   Yes [provider]  Riboflavin (VITAMIN B-2) 25 MG TABS Take 25 mg by mouth daily.   Yes [provider]  thiamine (VITAMIN B-1) 50 MG tablet Take 25 mg by mouth daily.   Yes [provider]  Ubiquinol 100 MG CAPS Take 100 mg by mouth daily.   Yes [provider]  vitamin A 16109 UNIT capsule Take 10,000 Units by mouth daily.   Yes [provider]  vitamin B-12 (CYANOCOBALAMIN) 250 MCG tablet Take 125 mcg by mouth daily.   Yes [provider]  vitamin B-6 (PYRIDOXINE) 25 MG tablet Take 25 mg by mouth daily.   Yes [provider]  vitamin C (ASCORBIC ACID) 500 MG tablet Take 500 mg by mouth daily.   Yes [provider]  meloxicam (MOBIC) 15 MG tablet Take 1 tablet (15 mg total) by mouth daily. Patient not taking: Reported on 07/23/2023 04/17/22   Georgina Quint, MD  metFORMIN (GLUCOPHAGE) 500 MG tablet Take 1 tablet (500 mg total) by mouth 2 (two) times daily with a meal. Patient not taking: Reported on 12/24/2022 10/10/22   Georgina Quint, MD  rosuvastatin (CRESTOR) 10 MG tablet Take 1 tablet (10 mg total) by mouth daily. Patient not taking: Reported on 07/23/2023 10/10/22   Edwina Barth  Jose, MD    Allergies  Allergen Reactions   Morphine And Codeine Other (See Comments)    Increased hearing, hallucinations, weird feeling   Penicillins    Doxycycline Itching and Rash        Hydrocodone Itching and Rash   Septra [Bactrim] Itching and Rash         Patient Active Problem List   Diagnosis Date Noted   OSA on CPAP 11/13/2022   Proteinuria 10/10/2022   Glycosuria 10/10/2022   Dyslipidemia associated with type 2 diabetes mellitus (HCC) 10/10/2022   Obstructive sleep apnea 06/28/2011   Sleep apnea 01/29/2011   Chronic systolic dysfunction of left ventricle 01/29/2011   ONYCHOMYCOSIS 06/27/2006   HYPERLIPIDEMIA 06/27/2006   ATRIAL FIBRILLATION 06/27/2006    Past Medical History:  Diagnosis Date   Atrial  fibrillation (HCC)    persistent,  s/p PVI by Dr Delena Serve at [facility] 1990s   Myalgia    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[facility])    "heart infection" in setting of pneumonia 1993   Sleep apnea    recent sleep study, results pending    Past Surgical History:  Procedure Laterality Date   ATRIAL ABLATION SURGERY  1999   afib ablation 1990s at [facility] by Ronn Melena   VENTRAL HERNIA REPAIR N/A 08/24/2016   Procedure: LAPAROSCOPIC VENTRAL HERNIA REPAIR WITH MESH;  Surgeon: Berna Bue, MD;  Location: [facility] OR;  Service: General;  Laterality: N/A;  Laparoscopic Repair of Umbilical and Supraumbilical Hernia with Mesh     Social History   Socioeconomic History   Marital status: Married    Spouse name: Tanna Savoy   Number of children: 2   Years of education: Not on file   Highest education level: Professional school degree (e.g., MD, DDS, DVM, JD)  Occupational History   Occupation: Sculptor  Tobacco Use   Smoking status: Never   Smokeless tobacco: Never   Tobacco comments:    tried smoking 30 years ago  Vaping Use   Vaping status: Never Used  Substance and Sexual Activity   Alcohol use: Yes    Alcohol/week: 6.0 standard drinks of alcohol    Types: 6 Glasses of wine per week    Comment: 1 glass of wine at night   Drug use: No   Sexual activity: Not on file  Other Topics Concern   Not on file  Social History Narrative   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  2 Children, 2 grandchildren   Social Drivers of Corporate investment banker Strain: Low Risk  (06/26/2023)   Overall Financial Resource Strain (CARDIA)    Difficulty of Paying Living Expenses: Not hard at all  Food Insecurity: No Food Insecurity (06/26/2023)   Hunger Vital Sign    Worried About Running Out of Food in the Last Year: Never true    Ran Out of Food in the Last Year: Never true  Transportation Needs: No  Transportation Needs (06/26/2023)   PRAPARE - Administrator, Civil Service (Medical): No    Lack of Transportation (Non-Medical): No  Physical Activity: Sufficiently Active (06/26/2023)   Exercise Vital Sign    Days of Exercise per Week: 5 days    Minutes of Exercise per Session: 60 min  Stress: No Stress Concern Present (06/26/2023)   [employer] of Occupational Health - Occupational Stress Questionnaire    Feeling of Stress :  Not at all  Social Connections: Moderately Integrated (06/26/2023)   Social Connection and Isolation Panel [NHANES]    Frequency of Communication with Friends and Family: Once a week    Frequency of Social Gatherings with Friends and Family: Once a week    Attends Religious Services: More than 4 times per year    Active Member of [employer] or Organizations: Yes    Attends Banker Meetings: Never    Marital Status: Married  Catering manager Violence: Not At Risk (06/26/2023)   Humiliation, Afraid, Rape, and Kick questionnaire    Fear of Current or Ex-Partner: No    Emotionally Abused: No    Physically Abused: No    Sexually Abused: No    Family History  Problem Relation Age of Onset   Diabetes Mother    Cancer Mother    Mental illness Mother    Heart disease Father    Hyperlipidemia Father    Mental illness Brother    Diabetes Maternal Grandmother    Heart disease Paternal Grandmother      Review of Systems  Constitutional: Negative.  Negative for chills and fever.  HENT: Negative.  Negative for congestion and sore throat.   Respiratory: Negative.  Negative for cough and shortness of breath.   Cardiovascular: Negative.  Negative for chest pain and palpitations.  Gastrointestinal:  Negative for abdominal pain, diarrhea, nausea and vomiting.  Genitourinary: Negative.  Negative for dysuria and hematuria.  Skin: Negative.  Negative for rash.  Neurological: Negative.  Negative for dizziness and headaches.  All other systems  reviewed and are negative.   Vitals:   07/23/23 1352  BP: 128/88  Pulse: 81  Temp: 98.5 F (36.9 C)  SpO2: 95%    Physical Exam Vitals reviewed.  Constitutional:      Appearance: Normal appearance.  HENT:     Head: Normocephalic.     Mouth/Throat:     Mouth: Mucous membranes are moist.     Pharynx: Oropharynx is clear.  Eyes:     Extraocular Movements: Extraocular movements intact.     Conjunctiva/sclera: Conjunctivae normal.     Pupils: Pupils are equal, round, and reactive to light.  Cardiovascular:     Rate and Rhythm: Normal rate and regular rhythm.     Pulses: Normal pulses.     Heart sounds: Normal heart sounds.  Pulmonary:     Effort: Pulmonary effort is normal.     Breath sounds: Normal breath sounds.  Musculoskeletal:     Cervical back: No tenderness.  Lymphadenopathy:     Cervical: No cervical adenopathy.  Skin:    General: Skin is warm and dry.     Capillary Refill: Capillary refill takes less than 2 seconds.  Neurological:     General: No focal deficit present.     Mental Status: He is alert and oriented to person, place, and time.  Psychiatric:        Mood and Affect: Mood normal.        Behavior: Behavior normal.     Results for orders placed or performed in visit on 07/23/23 (from the past 24 hours)  POCT HgB A1C     Status: Abnormal   Collection Time: 07/23/23  2:18 PM  Result Value Ref Range   Hemoglobin A1C 6.4 (A) 4.0 - 5.6 %   HbA1c POC (<> result, manual entry)     HbA1c, POC (prediabetic range)     HbA1c, POC (controlled diabetic range)  ASSESSMENT & PLAN: A total of 43 minutes was spent with the patient and counseling/coordination of care regarding preparing for this visit, review of most recent office visit notes, review of multiple chronic medical conditions and their management, review of all medications, review of most recent bloodwork results including interpretation of today's hemoglobin A1c, review of health maintenance  items, education on nutrition, prognosis, documentation, and need for follow up.   Problem List Items Addressed This Visit       Cardiovascular and Mediastinum   ATRIAL FIBRILLATION   Normal sinus rhythm today. Not on any medication      Chronic systolic dysfunction of left ventricle   Clinically euvolemic. Stable without symptoms of acute CHF Not on any medications at present time        Respiratory   Obstructive sleep apnea   Stable on CPAP treatment.        Endocrine   Dyslipidemia associated with type 2 diabetes mellitus (HCC) - Primary   Developed side effects to metformin and rosuvastatin Intolerant to these medications Diet and nutrition discussed Cardiovascular risk associated with dyslipidemia and diabetes discussed Hemoglobin A1c 6.4 today diet controlled. No need for medication at present time.      Relevant Orders   POCT HgB A1C (Completed)   Other Visit Diagnoses       Need for vaccination          Patient Instructions  Health Maintenance After Age 49 After age 75, you are at a higher risk for certain long-term diseases and infections as well as injuries from falls. Falls are a major cause of broken bones and head injuries in people who are older than age 33. Getting regular preventive care can help to keep you healthy and well. Preventive care includes getting regular testing and making lifestyle changes as recommended by your health care provider. Talk with your health care provider about: Which screenings and tests you should have. A screening is a test that checks for a disease when you have no symptoms. A diet and exercise plan that is right for you. What should I know about screenings and tests to prevent falls? Screening and testing are the best ways to find a health problem early. Early diagnosis and treatment give you the best chance of managing medical conditions that are common after age 76. Certain conditions and lifestyle choices may make  you more likely to have a fall. Your health care provider may recommend: Regular vision checks. Poor vision and conditions such as cataracts can make you more likely to have a fall. If you wear glasses, make sure to get your prescription updated if your vision changes. Medicine review. Work with your health care provider to regularly review all of the medicines you are taking, including over-the-counter medicines. Ask your health care provider about any side effects that may make you more likely to have a fall. Tell your health care provider if any medicines that you take make you feel dizzy or sleepy. Strength and balance checks. Your health care provider may recommend certain tests to check your strength and balance while standing, walking, or changing positions. Foot health exam. Foot pain and numbness, as well as not wearing proper footwear, can make you more likely to have a fall. Screenings, including: Osteoporosis screening. Osteoporosis is a condition that causes the bones to get weaker and break more easily. Blood pressure screening. Blood pressure changes and medicines to control blood pressure can make you feel dizzy. Depression screening. You may  be more likely to have a fall if you have a fear of falling, feel depressed, or feel unable to do activities that you used to do. Alcohol use screening. Using too much alcohol can affect your balance and may make you more likely to have a fall. Follow these instructions at home: Lifestyle Do not drink alcohol if: Your health care provider tells you not to drink. If you drink alcohol: Limit how much you have to: 0-1 drink a day for women. 0-2 drinks a day for men. Know how much alcohol is in your drink. In the [place] one drink equals one 12 oz bottle of beer (355 mL), one 5 oz glass of wine (148 mL), or one 1 oz glass of hard liquor (44 mL). Do not use any products that contain nicotine or tobacco. These products include cigarettes, chewing  tobacco, and vaping devices, such as e-cigarettes. If you need help quitting, ask your health care provider. Activity  Follow a regular exercise program to stay fit. This will help you maintain your balance. Ask your health care provider what types of exercise are appropriate for you. If you need a cane or walker, use it as recommended by your health care provider. Wear supportive shoes that have nonskid soles. Safety  Remove any tripping hazards, such as rugs, cords, and clutter. Install safety equipment such as grab bars in bathrooms and safety rails on stairs. Keep rooms and walkways well-lit. General instructions Talk with your health care provider about your risks for falling. Tell your health care provider if: You fall. Be sure to tell your health care provider about all falls, even ones that seem minor. You feel dizzy, tiredness (fatigue), or off-balance. Take over-the-counter and prescription medicines only as told by your health care provider. These include supplements. Eat a healthy diet and maintain a healthy weight. A healthy diet includes low-fat dairy products, low-fat (lean) meats, and fiber from whole grains, beans, and lots of fruits and vegetables. Stay current with your vaccines. Schedule regular health, dental, and eye exams. Summary Having a healthy lifestyle and getting preventive care can help to protect your health and wellness after age 56. Screening and testing are the best way to find a health problem early and help you avoid having a fall. Early diagnosis and treatment give you the best chance for managing medical conditions that are more common for people who are older than age 75. Falls are a major cause of broken bones and head injuries in people who are older than age 80. Take precautions to prevent a fall at home. Work with your health care provider to learn what changes you can make to improve your health and wellness and to prevent falls. This information is  not intended to replace advice given to you by your health care provider. Make sure you discuss any questions you have with your health care provider. Document Revised: 09/05/2020 Document Reviewed: 09/05/2020 Elsevier Patient Education  2024 Elsevier Inc.     Edwina Barth, MD [facility]

## 2023-07-23 NOTE — Patient Instructions (Signed)
 Health Maintenance After Age 74 After age 4, you are at a higher risk for certain long-term diseases and infections as well as injuries from falls. Falls are a major cause of broken bones and head injuries in people who are older than age 47. Getting regular preventive care can help to keep you healthy and well. Preventive care includes getting regular testing and making lifestyle changes as recommended by your health care provider. Talk with your health care provider about: Which screenings and tests you should have. A screening is a test that checks for a disease when you have no symptoms. A diet and exercise plan that is right for you. What should I know about screenings and tests to prevent falls? Screening and testing are the best ways to find a health problem early. Early diagnosis and treatment give you the best chance of managing medical conditions that are common after age 37. Certain conditions and lifestyle choices may make you more likely to have a fall. Your health care provider may recommend: Regular vision checks. Poor vision and conditions such as cataracts can make you more likely to have a fall. If you wear glasses, make sure to get your prescription updated if your vision changes. Medicine review. Work with your health care provider to regularly review all of the medicines you are taking, including over-the-counter medicines. Ask your health care provider about any side effects that may make you more likely to have a fall. Tell your health care provider if any medicines that you take make you feel dizzy or sleepy. Strength and balance checks. Your health care provider may recommend certain tests to check your strength and balance while standing, walking, or changing positions. Foot health exam. Foot pain and numbness, as well as not wearing proper footwear, can make you more likely to have a fall. Screenings, including: Osteoporosis screening. Osteoporosis is a condition that causes  the bones to get weaker and break more easily. Blood pressure screening. Blood pressure changes and medicines to control blood pressure can make you feel dizzy. Depression screening. You may be more likely to have a fall if you have a fear of falling, feel depressed, or feel unable to do activities that you used to do. Alcohol use screening. Using too much alcohol can affect your balance and may make you more likely to have a fall. Follow these instructions at home: Lifestyle Do not drink alcohol if: Your health care provider tells you not to drink. If you drink alcohol: Limit how much you have to: 0-1 drink a day for women. 0-2 drinks a day for men. Know how much alcohol is in your drink. In the [place] one drink equals one 12 oz bottle of beer (355 mL), one 5 oz glass of wine (148 mL), or one 1 oz glass of hard liquor (44 mL). Do not use any products that contain nicotine or tobacco. These products include cigarettes, chewing tobacco, and vaping devices, such as e-cigarettes. If you need help quitting, ask your health care provider. Activity  Follow a regular exercise program to stay fit. This will help you maintain your balance. Ask your health care provider what types of exercise are appropriate for you. If you need a cane or walker, use it as recommended by your health care provider. Wear supportive shoes that have nonskid soles. Safety  Remove any tripping hazards, such as rugs, cords, and clutter. Install safety equipment such as grab bars in bathrooms and safety rails on stairs. Keep rooms and walkways  well-lit. General instructions Talk with your health care provider about your risks for falling. Tell your health care provider if: You fall. Be sure to tell your health care provider about all falls, even ones that seem minor. You feel dizzy, tiredness (fatigue), or off-balance. Take over-the-counter and prescription medicines only as told by your health care provider. These include  supplements. Eat a healthy diet and maintain a healthy weight. A healthy diet includes low-fat dairy products, low-fat (lean) meats, and fiber from whole grains, beans, and lots of fruits and vegetables. Stay current with your vaccines. Schedule regular health, dental, and eye exams. Summary Having a healthy lifestyle and getting preventive care can help to protect your health and wellness after age 11. Screening and testing are the best way to find a health problem early and help you avoid having a fall. Early diagnosis and treatment give you the best chance for managing medical conditions that are more common for people who are older than age 28. Falls are a major cause of broken bones and head injuries in people who are older than age 48. Take precautions to prevent a fall at home. Work with your health care provider to learn what changes you can make to improve your health and wellness and to prevent falls. This information is not intended to replace advice given to you by your health care provider. Make sure you discuss any questions you have with your health care provider. Document Revised: 09/05/2020 Document Reviewed: 09/05/2020 Elsevier Patient Education  2024 [employer].

## 2023-07-23 NOTE — Assessment & Plan Note (Signed)
 Stable on CPAP treatment.

## 2023-07-23 NOTE — Assessment & Plan Note (Signed)
 Clinically euvolemic. Stable without symptoms of acute CHF Not on any medications at present time

## 2023-08-13 NOTE — Progress Notes (Unsigned)
  Electrophysiology Office Note:   Date:  08/14/2023  ID:  WOODROW DRAB, DOB 06-16-1950, MRN [id number]  Primary Cardiologist: Will Cortland Ding, MD Electrophysiologist: None      History of Present Illness:   William Dodson is a 73 y.o. male with h/o permanent AF, s/p ablation 1990s, and OSA seen today for routine electrophysiology followup.   Since last being seen in our clinic the patient reports doing very well. Overall,  he denies chest pain, palpitations, dyspnea, PND, orthopnea, nausea, vomiting, dizziness, syncope, edema, weight gain, or early satiety.  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   Review of systems complete and found to be negative unless listed in HPI.   EP Information / Studies Reviewed:    EKG is ordered today. Personal review as below.  EKG Interpretation Date/Time:  Wednesday August 14 2023 07:54:10 EDT Ventricular Rate:  78 PR Interval:    QRS Duration:  88 QT Interval:  364 QTC Calculation: 414 R Axis:   -29  Text Interpretation: Atrial fibrillation Low voltage QRS Septal infarct , age undetermined When compared with ECG of 21-Aug-2016 07:43, QRS axis Shifted left Septal infarct is now Present QT has shortened Confirmed by Pilar Bridge [phone]) on 08/14/2023 8:30:14 AM    Arrhythmia/Device History No specialty comments available.   Physical Exam:   VS:  BP 134/86   Pulse 78   Ht 5\' 4"  (1.626 m)   Wt 218 lb 6.4 oz (99.1 kg)   SpO2 98%   BMI 37.49 kg/m    Wt Readings from Last 3 Encounters:  08/14/23 218 lb 6.4 oz (99.1 kg)  07/23/23 220 lb (99.8 kg)  06/26/23 215 lb (97.5 kg)     GEN: No acute distress NECK: No JVD; No carotid bruits CARDIAC: Irregularly irregular rate and rhythm, no murmurs, rubs, gallops RESPIRATORY:  Clear to auscultation without rales, wheezing or rhonchi  ABDOMEN: Soft, non-tender, non-distended EXTREMITIES:  No edema; No deformity   ASSESSMENT  AND PLAN:    Permanent AF EKG today shows AF with controlled rate.  CHA2DS2VASc of at least 1. He is not on HTN agent and has diet controlled DM2.  Long discuss of OAC today. He understands if he requires HTN meds or DM2 meds recommendation will be to consider OAC or Watchman device.  Otherwise, in shared decision making will continue watchful waiting off of OAC at this time.   OSA  Encouraged nightly CPAP   Follow up with Dr. Lawana Pray in 6 months  Signed, Tylene Galla, PA-C

## 2023-08-14 ENCOUNTER — Encounter: Payer: Self-pay | Admitting: Student

## 2023-08-14 ENCOUNTER — Ambulatory Visit: Attending: Student | Admitting: Student

## 2023-08-14 VITALS — BP 134/86 | HR 78 | Ht 64.0 in | Wt 218.4 lb

## 2023-08-14 DIAGNOSIS — I4811 Longstanding persistent atrial fibrillation: Secondary | ICD-10-CM

## 2023-08-14 DIAGNOSIS — G4733 Obstructive sleep apnea (adult) (pediatric): Secondary | ICD-10-CM | POA: Diagnosis not present

## 2023-08-14 NOTE — Patient Instructions (Signed)
 Medication Instructions:  Your physician recommends that you continue on your current medications as directed. Please refer to the Current Medication list given to you today.  *If you need a refill on your cardiac medications before your next appointment, please call your pharmacy*  Lab Work: None ordered If you have labs (blood work) drawn today and your tests are completely normal, you will receive your results only by: MyChart Message (if you have MyChart) OR A paper copy in the mail If you have any lab test that is abnormal or we need to change your treatment, we will call you to review the results.  Follow-Up: At [facility], you and your health needs are our priority.  As part of our continuing mission to provide you with exceptional heart care, our providers are all part of one team.  This team includes your primary Cardiologist (physician) and Advanced Practice Providers or APPs (Physician Assistants and Nurse Practitioners) who all work together to provide you with the care you need, when you need it.  Your next appointment:   6 month(s)  Provider:   Loman Brooklyn, MD       1st Floor: - Lobby - Registration  - Pharmacy  - Lab - Cafe  2nd Floor: - PV Lab - Diagnostic Testing (echo, CT, nuclear med)  3rd Floor: - Vacant  4th Floor: - TCTS (cardiothoracic surgery) - AFib Clinic - Structural Heart Clinic - Vascular Surgery  - Vascular Ultrasound  5th Floor: - HeartCare Cardiology (general and EP) - Clinical Pharmacy for coumadin, hypertension, lipid, weight-loss medications, and med management appointments    Valet parking services will be available as well.

## 2023-09-04 ENCOUNTER — Encounter: Payer: Self-pay | Admitting: Cardiology

## 2023-09-04 ENCOUNTER — Encounter: Payer: Self-pay | Admitting: Emergency Medicine

## 2023-09-04 NOTE — Telephone Encounter (Signed)
 Spoke with patient, he asks for this to be sent over to [facility] at [phone] - unsure about the other fax number. Faxed at [phone] - No other needs

## 2023-09-13 DIAGNOSIS — G4733 Obstructive sleep apnea (adult) (pediatric): Secondary | ICD-10-CM | POA: Diagnosis not present

## 2023-10-01 DIAGNOSIS — H43812 Vitreous degeneration, left eye: Secondary | ICD-10-CM | POA: Diagnosis not present

## 2023-10-01 DIAGNOSIS — H35372 Puckering of macula, left eye: Secondary | ICD-10-CM | POA: Diagnosis not present

## 2023-10-01 DIAGNOSIS — G4733 Obstructive sleep apnea (adult) (pediatric): Secondary | ICD-10-CM | POA: Diagnosis not present

## 2023-10-01 DIAGNOSIS — H43811 Vitreous degeneration, right eye: Secondary | ICD-10-CM | POA: Diagnosis not present

## 2023-10-02 NOTE — Telephone Encounter (Signed)
 Do not understand the question.  Please clarify

## 2023-10-14 DIAGNOSIS — G4733 Obstructive sleep apnea (adult) (pediatric): Secondary | ICD-10-CM | POA: Diagnosis not present

## 2023-11-13 DIAGNOSIS — G4733 Obstructive sleep apnea (adult) (pediatric): Secondary | ICD-10-CM | POA: Diagnosis not present

## 2023-12-14 DIAGNOSIS — G4733 Obstructive sleep apnea (adult) (pediatric): Secondary | ICD-10-CM | POA: Diagnosis not present

## 2024-01-14 DIAGNOSIS — G4733 Obstructive sleep apnea (adult) (pediatric): Secondary | ICD-10-CM | POA: Diagnosis not present

## 2024-02-06 DIAGNOSIS — G4733 Obstructive sleep apnea (adult) (pediatric): Secondary | ICD-10-CM | POA: Diagnosis not present

## 2024-02-06 DIAGNOSIS — N182 Chronic kidney disease, stage 2 (mild): Secondary | ICD-10-CM | POA: Diagnosis not present

## 2024-02-06 DIAGNOSIS — Z809 Family history of malignant neoplasm, unspecified: Secondary | ICD-10-CM | POA: Diagnosis not present

## 2024-02-06 DIAGNOSIS — Z791 Long term (current) use of non-steroidal anti-inflammatories (NSAID): Secondary | ICD-10-CM | POA: Diagnosis not present

## 2024-02-06 DIAGNOSIS — Z833 Family history of diabetes mellitus: Secondary | ICD-10-CM | POA: Diagnosis not present

## 2024-02-06 DIAGNOSIS — Z880 Allergy status to penicillin: Secondary | ICD-10-CM | POA: Diagnosis not present

## 2024-02-06 DIAGNOSIS — I4891 Unspecified atrial fibrillation: Secondary | ICD-10-CM | POA: Diagnosis not present

## 2024-02-06 DIAGNOSIS — Z8249 Family history of ischemic heart disease and other diseases of the circulatory system: Secondary | ICD-10-CM | POA: Diagnosis not present

## 2024-05-20 ENCOUNTER — Ambulatory Visit: Admitting: Emergency Medicine

## 2024-05-20 ENCOUNTER — Ambulatory Visit: Payer: Self-pay | Admitting: Emergency Medicine

## 2024-05-20 ENCOUNTER — Encounter: Payer: Self-pay | Admitting: Emergency Medicine

## 2024-05-20 VITALS — BP 130/82 | HR 92 | Temp 98.0°F | Ht 64.0 in | Wt 221.8 lb

## 2024-05-20 DIAGNOSIS — Z1329 Encounter for screening for other suspected endocrine disorder: Secondary | ICD-10-CM

## 2024-05-20 DIAGNOSIS — Z0000 Encounter for general adult medical examination without abnormal findings: Secondary | ICD-10-CM | POA: Diagnosis not present

## 2024-05-20 DIAGNOSIS — Z130 Encounter for screening for diseases of the blood and blood-forming organs and certain disorders involving the immune mechanism: Secondary | ICD-10-CM

## 2024-05-20 DIAGNOSIS — I480 Paroxysmal atrial fibrillation: Secondary | ICD-10-CM | POA: Diagnosis not present

## 2024-05-20 DIAGNOSIS — G4733 Obstructive sleep apnea (adult) (pediatric): Secondary | ICD-10-CM

## 2024-05-20 DIAGNOSIS — Z125 Encounter for screening for malignant neoplasm of prostate: Secondary | ICD-10-CM | POA: Diagnosis not present

## 2024-05-20 DIAGNOSIS — Z0001 Encounter for general adult medical examination with abnormal findings: Secondary | ICD-10-CM

## 2024-05-20 DIAGNOSIS — E1169 Type 2 diabetes mellitus with other specified complication: Secondary | ICD-10-CM

## 2024-05-20 DIAGNOSIS — E785 Hyperlipidemia, unspecified: Secondary | ICD-10-CM

## 2024-05-20 DIAGNOSIS — Z13228 Encounter for screening for other metabolic disorders: Secondary | ICD-10-CM

## 2024-05-20 DIAGNOSIS — I519 Heart disease, unspecified: Secondary | ICD-10-CM

## 2024-05-20 LAB — PSA: PSA: 1.64 ng/mL (ref 0.10–4.00)

## 2024-05-20 LAB — LIPID PANEL
Cholesterol: 201 mg/dL — ABNORMAL HIGH (ref 28–200)
HDL: 51.1 mg/dL
LDL Cholesterol: 117 mg/dL — ABNORMAL HIGH (ref 10–99)
NonHDL: 149.78
Total CHOL/HDL Ratio: 4
Triglycerides: 164 mg/dL — ABNORMAL HIGH (ref 10.0–149.0)
VLDL: 32.8 mg/dL (ref 0.0–40.0)

## 2024-05-20 LAB — CBC WITH DIFFERENTIAL/PLATELET
Basophils Absolute: 0.1 K/uL (ref 0.0–0.1)
Basophils Relative: 1.8 % (ref 0.0–3.0)
Eosinophils Absolute: 0.1 K/uL (ref 0.0–0.7)
Eosinophils Relative: 2.9 % (ref 0.0–5.0)
HCT: 46.6 % (ref 39.0–52.0)
Hemoglobin: 15.8 g/dL (ref 13.0–17.0)
Lymphocytes Relative: 43.9 % (ref 12.0–46.0)
Lymphs Abs: 2.1 K/uL (ref 0.7–4.0)
MCHC: 34 g/dL (ref 30.0–36.0)
MCV: 91.8 fl (ref 78.0–100.0)
Monocytes Absolute: 0.6 K/uL (ref 0.1–1.0)
Monocytes Relative: 12.2 % — ABNORMAL HIGH (ref 3.0–12.0)
Neutro Abs: 1.9 K/uL (ref 1.4–7.7)
Neutrophils Relative %: 39.2 % — ABNORMAL LOW (ref 43.0–77.0)
Platelets: 202 K/uL (ref 150.0–400.0)
RBC: 5.07 Mil/uL (ref 4.22–5.81)
RDW: 14 % (ref 11.5–15.5)
WBC: 4.9 K/uL (ref 4.0–10.5)

## 2024-05-20 LAB — MICROALBUMIN / CREATININE URINE RATIO
Creatinine,U: 81.2 mg/dL
Microalb Creat Ratio: 23.2 mg/g (ref 0.0–30.0)
Microalb, Ur: 1.9 mg/dL (ref 0.7–1.9)

## 2024-05-20 LAB — COMPREHENSIVE METABOLIC PANEL WITH GFR
ALT: 33 U/L (ref 3–53)
AST: 26 U/L (ref 5–37)
Albumin: 4.3 g/dL (ref 3.5–5.2)
Alkaline Phosphatase: 57 U/L (ref 39–117)
BUN: 17 mg/dL (ref 6–23)
CO2: 27 meq/L (ref 19–32)
Calcium: 9.4 mg/dL (ref 8.4–10.5)
Chloride: 103 meq/L (ref 96–112)
Creatinine, Ser: 0.92 mg/dL (ref 0.40–1.50)
GFR: 81.92 mL/min
Glucose, Bld: 127 mg/dL — ABNORMAL HIGH (ref 70–99)
Potassium: 4.1 meq/L (ref 3.5–5.1)
Sodium: 141 meq/L (ref 135–145)
Total Bilirubin: 0.7 mg/dL (ref 0.2–1.2)
Total Protein: 7.5 g/dL (ref 6.0–8.3)

## 2024-05-20 LAB — HEMOGLOBIN A1C: Hgb A1c MFr Bld: 7.3 % — ABNORMAL HIGH (ref 4.6–6.5)

## 2024-05-20 NOTE — Progress Notes (Signed)
 William Dodson 74 y.o.   Chief Complaint  Patient presents with   Annual Exam    Annual exam    HISTORY OF PRESENT ILLNESS: This is a 74 y.o. male here for annual exam and follow-up on multiple chronic medical conditions including hypertension and diabetes Overall doing well. No complaints or medical concerns today.  HPI   Prior to Admission medications  Medication Sig Start Date End Date Taking? Authorizing Provider  b complex vitamins tablet Take 1 tablet by mouth daily.   Yes [provider]  BIOTIN PO Take 50 mcg by mouth daily.   Yes [provider]  Cholecalciferol (VITAMIN D3) 5000 units CAPS Take 5,000 Units by mouth daily.   Yes [provider]  CHOLINE PO Take 20 mg by mouth daily.   Yes [provider]  Chromium Picolinate (CHROMIUM PICOLATE PO) Take 200 mcg by mouth daily.   Yes [provider]  Folic Acid (FOLATE PO) Take 200 mcg by mouth daily.   Yes [provider]  ibuprofen (ADVIL,MOTRIN) 200 MG tablet Take 200-400 mg by mouth every 8 (eight) hours as needed for moderate pain.   Yes [provider]  INOSITOL PO Take 50 mg by mouth daily.   Yes [provider]  Anselm Oil 1000 MG CAPS Take 1,000 mg by mouth daily.   Yes [provider]  MAGNESIUM PO Take 500 mg by mouth daily.   Yes [provider]  Milk Thistle 175 MG CAPS Take 175 mg by mouth daily.   Yes [provider]  Misc Natural Products (SAW PALMETTO) CAPS Take 500 mg by mouth daily.   Yes [provider]  Niacin (VITAMIN B-3 PO) Take 50 mg by mouth daily.   Yes [provider]  P-Aminobenzoic Acid (PABA) 100 MG TABS Take 25 mg by mouth daily.   Yes [provider]  Pantothenic Acid 250 MG CAPS Take 125 mg by mouth daily.   Yes [provider]  thiamine (VITAMIN B-1) 50 MG tablet Take 25 mg by mouth daily.   Yes [provider]  Ubiquinol 100 MG CAPS Take 100 mg  by mouth daily.   Yes [provider]  vitamin A 10000 UNIT capsule Take 10,000 Units by mouth daily.   Yes [provider]  vitamin B-12 (CYANOCOBALAMIN) 250 MCG tablet Take 125 mcg by mouth daily.   Yes [provider]  vitamin B-6 (PYRIDOXINE) 25 MG tablet Take 25 mg by mouth daily.   Yes [provider]  vitamin C (ASCORBIC ACID) 500 MG tablet Take 500 mg by mouth daily.   Yes [provider]  meloxicam  (MOBIC ) 15 MG tablet Take 1 tablet (15 mg total) by mouth daily. Patient not taking: Reported on 05/20/2024 04/17/22   Purcell Emil Schanz, MD  Riboflavin (VITAMIN B-2) 25 MG TABS Take 25 mg by mouth daily. Patient not taking: Reported on 05/20/2024    [provider]    Allergies[1]  Patient Active Problem List   Diagnosis Date Noted   Obesity, morbid (HCC) 05/20/2024   OSA on CPAP 11/13/2022   Dyslipidemia associated with type 2 diabetes mellitus (HCC) 10/10/2022   Obstructive sleep apnea 06/28/2011   Sleep apnea 01/29/2011   Chronic systolic dysfunction of left ventricle 01/29/2011   ATRIAL FIBRILLATION 06/27/2006    Past Medical History:  Diagnosis Date   Atrial fibrillation (HCC)    persistent,  s/p PVI by Dr Katina at [facility]  Myalgia    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(HCC)    heart infection in setting of pneumonia 1993   Sleep apnea    recent sleep study, results pending    Past Surgical History:  Procedure Laterality Date   ATRIAL ABLATION SURGERY  1999   afib ablation 1990s at [facility] by Jerona Bright   VENTRAL HERNIA REPAIR N/A 08/24/2016   Procedure: LAPAROSCOPIC VENTRAL HERNIA REPAIR WITH MESH;  Surgeon: Mitzie DELENA Freund, MD;  Location: [facility] OR;  Service: General;  Laterality: N/A;  Laparoscopic Repair of Umbilical and Supraumbilical Hernia with Mesh     Social History   Socioeconomic History   Marital status: Married    Spouse name: Homer   Number of children: 2   Years of education: Not on file   Highest  education level: Professional school degree (e.g., MD, DDS, DVM, JD)  Occupational History   Occupation: Sculptor  Tobacco Use   Smoking status: Never   Smokeless tobacco: Never   Tobacco comments:    tried smoking 30 years ago  Vaping Use   Vaping status: Never Used  Substance and Sexual Activity   Alcohol use: Yes    Alcohol/week: 6.0 standard drinks of alcohol    Types: 6 Glasses of wine per week    Comment: 1 glass of wine at night   Drug use: No   Sexual activity: Not on file  Other Topics Concern   Not on file  Social History Narrative   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  2 Children, 2 grandchildren   Social Drivers of Health   Tobacco Use: Low Risk (05/20/2024)   Patient History    Smoking Tobacco Use: Never    Smokeless Tobacco Use: Never    Passive Exposure: Not on file  Financial Resource Strain: Low Risk (06/26/2023)   Overall Financial Resource Strain (CARDIA)    Difficulty of Paying Living Expenses: Not hard at all  Food Insecurity: No Food Insecurity (06/26/2023)   Hunger Vital Sign    Worried About Running Out of Food in the Last Year: Never true    Ran Out of Food in the Last Year: Never true  Transportation Needs: No Transportation Needs (06/26/2023)   PRAPARE - Administrator, Civil Service (Medical): No    Lack of Transportation (Non-Medical): No  Physical Activity: Sufficiently Active (06/26/2023)   Exercise Vital Sign    Days of Exercise per Week: 5 days    Minutes of Exercise per Session: 60 min  Stress: No Stress Concern Present (06/26/2023)   [employer] of Occupational Health - Occupational Stress Questionnaire    Feeling of Stress : Not at all  Social Connections: Moderately Integrated (06/26/2023)   Social Connection and Isolation Panel    Frequency of Communication with Friends and Family: Once a week     Frequency of Social Gatherings with Friends and Family: Once a week    Attends Religious Services: More than 4 times per year    Active Member of [employer] or Organizations: Yes    Attends Banker Meetings: Never    Marital Status: Married  Catering Manager Violence: Not At Risk (06/26/2023)   Humiliation, Afraid, Rape, and Kick questionnaire    Fear of Current or Ex-Partner: No    Emotionally Abused: No    Physically Abused: No    Sexually Abused: No  Depression (PHQ2-9):  Low Risk (05/20/2024)   Depression (PHQ2-9)    PHQ-2 Score: 0  Alcohol Screen: Low Risk (06/26/2023)   Alcohol Screen    Last Alcohol Screening Score (AUDIT): 0  Housing: Unknown (06/26/2023)   Housing Stability Vital Sign    Unable to Pay for Housing in the Last Year: No    Number of Times Moved in the Last Year: Not on file    Homeless in the Last Year: No  Utilities: Not At Risk (06/26/2023)   AHC Utilities    Threatened with loss of utilities: No  Health Literacy: Adequate Health Literacy (06/26/2023)   B1300 Health Literacy    Frequency of need for help with medical instructions: Never    Family History  Problem Relation Age of Onset   Diabetes Mother    Cancer Mother    Mental illness Mother    Heart disease Father    Hyperlipidemia Father    Mental illness Brother    Diabetes Maternal Grandmother    Heart disease Paternal Grandmother      Review of Systems  Constitutional: Negative.  Negative for chills and fever.  HENT: Negative.  Negative for congestion and sore throat.   Respiratory: Negative.  Negative for cough and shortness of breath.   Cardiovascular:  Positive for palpitations. Negative for chest pain.  Gastrointestinal:  Negative for abdominal pain, diarrhea, nausea and vomiting.  Genitourinary: Negative.  Negative for dysuria and hematuria.  Skin: Negative.  Negative for rash.  Neurological: Negative.  Negative for dizziness and headaches.  All other systems reviewed and  are negative.   Vitals:   05/20/24 0858  BP: 130/82  Pulse: 92  Temp: 98 F (36.7 C)  SpO2: 99%    Physical Exam Vitals reviewed.  Constitutional:      Appearance: Normal appearance.  HENT:     Head: Normocephalic.     Mouth/Throat:     Mouth: Mucous membranes are moist.     Pharynx: Oropharynx is clear.  Eyes:     Extraocular Movements: Extraocular movements intact.     Pupils: Pupils are equal, round, and reactive to light.  Cardiovascular:     Rate and Rhythm: Normal rate. Rhythm irregular.     Pulses: Normal pulses.     Heart sounds: Normal heart sounds.  Pulmonary:     Effort: Pulmonary effort is normal.     Breath sounds: Normal breath sounds.  Abdominal:     Palpations: Abdomen is soft.     Tenderness: There is no abdominal tenderness.  Skin:    General: Skin is warm and dry.     Capillary Refill: Capillary refill takes less than 2 seconds.  Neurological:     General: No focal deficit present.     Mental Status: He is alert and oriented to person, place, and time.  Psychiatric:        Mood and Affect: Mood normal.        Behavior: Behavior normal.      ASSESSMENT & PLAN:  Problem List Items Addressed This Visit       Cardiovascular and Mediastinum   ATRIAL FIBRILLATION   Currently in A-fib with controlled ventricular rate Not on anticoagulants Not on beta-blockers Not on any medication Sees Dr. Inocencio, Cardiologist next week for follow-up      Chronic systolic dysfunction of left ventricle   Clinically euvolemic. Stable without symptoms of acute CHF Not on any medications at present time        Respiratory  OSA on CPAP   Stable on CPAP treatment.         Endocrine   Dyslipidemia associated with type 2 diabetes mellitus (HCC)   Developed side effects to metformin  and rosuvastatin  Intolerant to these medications Diet and nutrition discussed Cardiovascular risk associated with dyslipidemia and diabetes discussed Hemoglobin A1c 6.4  today diet controlled. No need for medication at present time.      Relevant Orders   Microalbumin / creatinine urine ratio   CBC with Differential/Platelet   Comprehensive metabolic panel with GFR   Hemoglobin A1c   Lipid panel     Other   Obesity, morbid (HCC)   Associated with diabetes and dyslipidemia Diet and nutrition discussed Advised to decrease amount of daily carbohydrate intake and daily calories and increase amount of plant-based protein in his diet Benefits of exercise discussed      Other Visit Diagnoses       Encounter for general adult medical examination with abnormal findings    -  Primary   Relevant Orders   Microalbumin / creatinine urine ratio   PSA   CBC with Differential/Platelet   Comprehensive metabolic panel with GFR   Hemoglobin A1c   Lipid panel     Screening for deficiency anemia       Relevant Orders   CBC with Differential/Platelet     Screening for endocrine, metabolic and immunity disorder       Relevant Orders   Comprehensive metabolic panel with GFR     Screening for prostate cancer       Relevant Orders   PSA      Modifiable risk factors discussed with patient. Anticipatory guidance according to age provided. The following topics were also discussed: Social Determinants of Health Smoking.  Non-smoker Diet and nutrition Benefits of exercise Cancer screening and review of most recent colonoscopy report Vaccinations review and recommendations Cardiovascular risk assessment and need for blood work Review multiple chronic medical conditions and their management Review of all medications Mental health including depression and anxiety Fall and accident prevention  Patient Instructions  Health Maintenance After Age 63 After age 14, you are at a higher risk for certain long-term diseases and infections as well as injuries from falls. Falls are a major cause of broken bones and head injuries in people who are older than age 47.  Getting regular preventive care can help to keep you healthy and well. Preventive care includes getting regular testing and making lifestyle changes as recommended by your health care provider. Talk with your health care provider about: Which screenings and tests you should have. A screening is a test that checks for a disease when you have no symptoms. A diet and exercise plan that is right for you. What should I know about screenings and tests to prevent falls? Screening and testing are the best ways to find a health problem early. Early diagnosis and treatment give you the best chance of managing medical conditions that are common after age 32. Certain conditions and lifestyle choices may make you more likely to have a fall. Your health care provider may recommend: Regular vision checks. Poor vision and conditions such as cataracts can make you more likely to have a fall. If you wear glasses, make sure to get your prescription updated if your vision changes. Medicine review. Work with your health care provider to regularly review all of the medicines you are taking, including over-the-counter medicines. Ask your health care provider about any side effects that  may make you more likely to have a fall. Tell your health care provider if any medicines that you take make you feel dizzy or sleepy. Strength and balance checks. Your health care provider may recommend certain tests to check your strength and balance while standing, walking, or changing positions. Foot health exam. Foot pain and numbness, as well as not wearing proper footwear, can make you more likely to have a fall. Screenings, including: Osteoporosis screening. Osteoporosis is a condition that causes the bones to get weaker and break more easily. Blood pressure screening. Blood pressure changes and medicines to control blood pressure can make you feel dizzy. Depression screening. You may be more likely to have a fall if you have a fear of  falling, feel depressed, or feel unable to do activities that you used to do. Alcohol use screening. Using too much alcohol can affect your balance and may make you more likely to have a fall. Follow these instructions at home: Lifestyle Do not drink alcohol if: Your health care provider tells you not to drink. If you drink alcohol: Limit how much you have to: 0-1 drink a day for women. 0-2 drinks a day for men. Know how much alcohol is in your drink. In the [place] one drink equals one 12 oz bottle of beer (355 mL), one 5 oz glass of wine (148 mL), or one 1 oz glass of hard liquor (44 mL). Do not use any products that contain nicotine or tobacco. These products include cigarettes, chewing tobacco, and vaping devices, such as e-cigarettes. If you need help quitting, ask your health care provider. Activity  Follow a regular exercise program to stay fit. This will help you maintain your balance. Ask your health care provider what types of exercise are appropriate for you. If you need a cane or walker, use it as recommended by your health care provider. Wear supportive shoes that have nonskid soles. Safety  Remove any tripping hazards, such as rugs, cords, and clutter. Install safety equipment such as grab bars in bathrooms and safety rails on stairs. Keep rooms and walkways well-lit. General instructions Talk with your health care provider about your risks for falling. Tell your health care provider if: You fall. Be sure to tell your health care provider about all falls, even ones that seem minor. You feel dizzy, tiredness (fatigue), or off-balance. Take over-the-counter and prescription medicines only as told by your health care provider. These include supplements. Eat a healthy diet and maintain a healthy weight. A healthy diet includes low-fat dairy products, low-fat (lean) meats, and fiber from whole grains, beans, and lots of fruits and vegetables. Stay current with your  vaccines. Schedule regular health, dental, and eye exams. Summary Having a healthy lifestyle and getting preventive care can help to protect your health and wellness after age 74. Screening and testing are the best way to find a health problem early and help you avoid having a fall. Early diagnosis and treatment give you the best chance for managing medical conditions that are more common for people who are older than age 33. Falls are a major cause of broken bones and head injuries in people who are older than age 45. Take precautions to prevent a fall at home. Work with your health care provider to learn what changes you can make to improve your health and wellness and to prevent falls. This information is not intended to replace advice given to you by your health care provider. Make sure you discuss any  questions you have with your health care provider. Document Revised: 09/05/2020 Document Reviewed: 09/05/2020 Elsevier Patient Education  2024 Elsevier Inc.     Emil Schaumann, MD  Primary Care at [facility]    [1]  Allergies Allergen Reactions   Morphine And Codeine Other (See Comments)    Increased hearing, hallucinations, weird feeling   Penicillins    Doxycycline Itching and Rash        Hydrocodone Itching and Rash   Septra [Bactrim] Itching and Rash

## 2024-05-20 NOTE — Assessment & Plan Note (Signed)
 Stable on CPAP treatment.

## 2024-05-20 NOTE — Assessment & Plan Note (Signed)
 Developed side effects to metformin and rosuvastatin Intolerant to these medications Diet and nutrition discussed Cardiovascular risk associated with dyslipidemia and diabetes discussed Hemoglobin A1c 6.4 today diet controlled. No need for medication at present time.

## 2024-05-20 NOTE — Assessment & Plan Note (Signed)
 Associated with diabetes and dyslipidemia Diet and nutrition discussed Advised to decrease amount of daily carbohydrate intake and daily calories and increase amount of plant-based protein in his diet Benefits of exercise discussed

## 2024-05-20 NOTE — Patient Instructions (Signed)
 Health Maintenance After Age 75 After age 27, you are at a higher risk for certain long-term diseases and infections as well as injuries from falls. Falls are a major cause of broken bones and head injuries in people who are older than age 73. Getting regular preventive care can help to keep you healthy and well. Preventive care includes getting regular testing and making lifestyle changes as recommended by your health care provider. Talk with your health care provider about: Which screenings and tests you should have. A screening is a test that checks for a disease when you have no symptoms. A diet and exercise plan that is right for you. What should I know about screenings and tests to prevent falls? Screening and testing are the best ways to find a health problem early. Early diagnosis and treatment give you the best chance of managing medical conditions that are common after age 90. Certain conditions and lifestyle choices may make you more likely to have a fall. Your health care provider may recommend: Regular vision checks. Poor vision and conditions such as cataracts can make you more likely to have a fall. If you wear glasses, make sure to get your prescription updated if your vision changes. Medicine review. Work with your health care provider to regularly review all of the medicines you are taking, including over-the-counter medicines. Ask your health care provider about any side effects that may make you more likely to have a fall. Tell your health care provider if any medicines that you take make you feel dizzy or sleepy. Strength and balance checks. Your health care provider may recommend certain tests to check your strength and balance while standing, walking, or changing positions. Foot health exam. Foot pain and numbness, as well as not wearing proper footwear, can make you more likely to have a fall. Screenings, including: Osteoporosis screening. Osteoporosis is a condition that causes  the bones to get weaker and break more easily. Blood pressure screening. Blood pressure changes and medicines to control blood pressure can make you feel dizzy. Depression screening. You may be more likely to have a fall if you have a fear of falling, feel depressed, or feel unable to do activities that you used to do. Alcohol  use screening. Using too much alcohol  can affect your balance and may make you more likely to have a fall. Follow these instructions at home: Lifestyle Do not drink alcohol  if: Your health care provider tells you not to drink. If you drink alcohol : Limit how much you have to: 0-1 drink a day for women. 0-2 drinks a day for men. Know how much alcohol  is in your drink. In the [place] one drink equals one 12 oz bottle of beer (355 mL), one 5 oz glass of wine (148 mL), or one 1 oz glass of hard liquor (44 mL). Do not use any products that contain nicotine or tobacco. These products include cigarettes, chewing tobacco, and vaping devices, such as e-cigarettes. If you need help quitting, ask your health care provider. Activity  Follow a regular exercise program to stay fit. This will help you maintain your balance. Ask your health care provider what types of exercise are appropriate for you. If you need a cane or walker, use it as recommended by your health care provider. Wear supportive shoes that have nonskid soles. Safety  Remove any tripping hazards, such as rugs, cords, and clutter. Install safety equipment such as grab bars in bathrooms and safety rails on stairs. Keep rooms and walkways  well-lit. General instructions Talk with your health care provider about your risks for falling. Tell your health care provider if: You fall. Be sure to tell your health care provider about all falls, even ones that seem minor. You feel dizzy, tiredness (fatigue), or off-balance. Take over-the-counter and prescription medicines only as told by your health care provider. These include  supplements. Eat a healthy diet and maintain a healthy weight. A healthy diet includes low-fat dairy products, low-fat (lean) meats, and fiber from whole grains, beans, and lots of fruits and vegetables. Stay current with your vaccines. Schedule regular health, dental, and eye exams. Summary Having a healthy lifestyle and getting preventive care can help to protect your health and wellness after age 15. Screening and testing are the best way to find a health problem early and help you avoid having a fall. Early diagnosis and treatment give you the best chance for managing medical conditions that are more common for people who are older than age 42. Falls are a major cause of broken bones and head injuries in people who are older than age 64. Take precautions to prevent a fall at home. Work with your health care provider to learn what changes you can make to improve your health and wellness and to prevent falls. This information is not intended to replace advice given to you by your health care provider. Make sure you discuss any questions you have with your health care provider. Document Revised: 09/05/2020 Document Reviewed: 09/05/2020 Elsevier Patient Education  2024 [employer].

## 2024-05-20 NOTE — Assessment & Plan Note (Signed)
 Currently in A-fib with controlled ventricular rate Not on anticoagulants Not on beta-blockers Not on any medication Sees Dr. Inocencio, Cardiologist next week for follow-up

## 2024-05-20 NOTE — Assessment & Plan Note (Signed)
 Clinically euvolemic. Stable without symptoms of acute CHF Not on any medications at present time

## 2024-06-10 ENCOUNTER — Ambulatory Visit: Admitting: Cardiology

## 2024-06-25 ENCOUNTER — Ambulatory Visit

## 2024-06-26 ENCOUNTER — Ambulatory Visit: Payer: Medicare PPO

## 2024-11-25 ENCOUNTER — Ambulatory Visit: Admitting: Emergency Medicine

## 2024-11-25 ENCOUNTER — Ambulatory Visit
